# Patient Record
Sex: Male | Born: 1975 | Race: White | Hispanic: No | Smoking: Current every day smoker
Health system: Southern US, Community
[De-identification: ages and names within clinical notes are randomized; demographics above are authoritative.]

## PROBLEM LIST (undated history)

## (undated) DIAGNOSIS — F419 Anxiety disorder, unspecified: Secondary | ICD-10-CM

## (undated) DIAGNOSIS — S82899A Other fracture of unspecified lower leg, initial encounter for closed fracture: Secondary | ICD-10-CM

---

## 2003-05-13 ENCOUNTER — Ambulatory Visit (HOSPITAL_COMMUNITY): Admission: RE | Admit: 2003-05-13 | Discharge: 2003-05-13 | Payer: Self-pay | Admitting: General Surgery

## 2004-02-07 ENCOUNTER — Emergency Department (HOSPITAL_COMMUNITY): Admission: EM | Admit: 2004-02-07 | Discharge: 2004-02-07 | Payer: Self-pay | Admitting: Emergency Medicine

## 2004-02-08 ENCOUNTER — Inpatient Hospital Stay (HOSPITAL_COMMUNITY): Admission: EM | Admit: 2004-02-08 | Discharge: 2004-02-13 | Payer: Self-pay | Admitting: Emergency Medicine

## 2004-07-30 ENCOUNTER — Emergency Department (HOSPITAL_COMMUNITY): Admission: EM | Admit: 2004-07-30 | Discharge: 2004-07-30 | Payer: Self-pay | Admitting: *Deleted

## 2004-08-02 ENCOUNTER — Emergency Department (HOSPITAL_COMMUNITY): Admission: EM | Admit: 2004-08-02 | Discharge: 2004-08-02 | Payer: Self-pay | Admitting: Emergency Medicine

## 2004-10-18 ENCOUNTER — Emergency Department (HOSPITAL_COMMUNITY): Admission: EM | Admit: 2004-10-18 | Discharge: 2004-10-18 | Payer: Self-pay | Admitting: Emergency Medicine

## 2009-12-30 ENCOUNTER — Emergency Department (HOSPITAL_COMMUNITY): Admission: EM | Admit: 2009-12-30 | Discharge: 2009-12-31 | Payer: Self-pay | Admitting: Emergency Medicine

## 2010-03-11 ENCOUNTER — Emergency Department (HOSPITAL_COMMUNITY)
Admission: EM | Admit: 2010-03-11 | Discharge: 2010-03-11 | Payer: Self-pay | Source: Home / Self Care | Admitting: Emergency Medicine

## 2010-04-18 ENCOUNTER — Emergency Department (HOSPITAL_COMMUNITY)
Admission: EM | Admit: 2010-04-18 | Discharge: 2010-04-18 | Disposition: A | Discharge status: Home / Self Care | Payer: Self-pay | Attending: Emergency Medicine | Admitting: Emergency Medicine

## 2010-04-18 ENCOUNTER — Emergency Department (HOSPITAL_COMMUNITY): Payer: Self-pay

## 2010-04-18 DIAGNOSIS — F341 Dysthymic disorder: Secondary | ICD-10-CM | POA: Insufficient documentation

## 2010-04-18 DIAGNOSIS — R000 Tachycardia, unspecified: Secondary | ICD-10-CM | POA: Insufficient documentation

## 2010-04-18 DIAGNOSIS — Z79899 Other long term (current) drug therapy: Secondary | ICD-10-CM | POA: Insufficient documentation

## 2010-04-18 DIAGNOSIS — R071 Chest pain on breathing: Secondary | ICD-10-CM | POA: Insufficient documentation

## 2010-04-18 LAB — POCT I-STAT, CHEM 8
BUN: 15 mg/dL (ref 6–23)
Creatinine, Ser: 1 mg/dL (ref 0.4–1.5)
Glucose, Bld: 100 mg/dL — ABNORMAL HIGH (ref 70–99)
Sodium: 141 mEq/L (ref 135–145)
TCO2: 29 mmol/L (ref 0–100)

## 2010-07-02 ENCOUNTER — Emergency Department (HOSPITAL_COMMUNITY)
Admission: EM | Admit: 2010-07-02 | Discharge: 2010-07-03 | Disposition: A | Discharge status: Home / Self Care | Payer: Self-pay | Attending: Emergency Medicine | Admitting: Emergency Medicine

## 2010-07-02 ENCOUNTER — Emergency Department (HOSPITAL_COMMUNITY): Payer: Self-pay

## 2010-07-02 DIAGNOSIS — F3289 Other specified depressive episodes: Secondary | ICD-10-CM | POA: Insufficient documentation

## 2010-07-02 DIAGNOSIS — F411 Generalized anxiety disorder: Secondary | ICD-10-CM | POA: Insufficient documentation

## 2010-07-02 DIAGNOSIS — Y998 Other external cause status: Secondary | ICD-10-CM | POA: Insufficient documentation

## 2010-07-02 DIAGNOSIS — F329 Major depressive disorder, single episode, unspecified: Secondary | ICD-10-CM | POA: Insufficient documentation

## 2010-07-02 DIAGNOSIS — Y92009 Unspecified place in unspecified non-institutional (private) residence as the place of occurrence of the external cause: Secondary | ICD-10-CM | POA: Insufficient documentation

## 2010-07-02 DIAGNOSIS — W08XXXA Fall from other furniture, initial encounter: Secondary | ICD-10-CM | POA: Insufficient documentation

## 2010-07-02 DIAGNOSIS — Y93E9 Activity, other interior property and clothing maintenance: Secondary | ICD-10-CM | POA: Insufficient documentation

## 2010-07-02 DIAGNOSIS — M79609 Pain in unspecified limb: Secondary | ICD-10-CM | POA: Insufficient documentation

## 2010-07-02 DIAGNOSIS — Z79899 Other long term (current) drug therapy: Secondary | ICD-10-CM | POA: Insufficient documentation

## 2010-07-02 DIAGNOSIS — S20219A Contusion of unspecified front wall of thorax, initial encounter: Secondary | ICD-10-CM | POA: Insufficient documentation

## 2010-07-02 DIAGNOSIS — R079 Chest pain, unspecified: Secondary | ICD-10-CM | POA: Insufficient documentation

## 2010-07-02 DIAGNOSIS — M25559 Pain in unspecified hip: Secondary | ICD-10-CM | POA: Insufficient documentation

## 2010-07-27 NOTE — Group Therapy Note (Signed)
[name], Gray NO.:  [id number]   MEDICAL RECORD NO.:  [id number]         PATIENT TYPE:  INP   LOCATION:  A332                          FACILITY:  [facility]   PHYSICIAN:  Vania Rea, M.D. DATE OF BIRTH:  05-16-1975   DATE OF PROCEDURE:  02/09/2004  DATE OF DISCHARGE:                                   PROGRESS NOTE   This is a progress note on Pedro Gray FA#[id number]   Hospital Day #2   SUBJECTIVE:  Patient feels much better, but he is still having significant  pain.  He has been seen by the surgeons who are planning incision and  drainage and debridement tomorrow.   OBJECTIVE:  VITAL SIGNS:  Temperature 97.5, pulse 88, respirations 20, blood  pressure 106/77.  CHEST:  His chest is clear to auscultation bilaterally.  CARDIOVASCULAR SYSTEM:  Regular rhythm.  EXTREMITIES:  His extremities are  without edema.  He has no calf tenderness. His right arm swelling is  significantly reduced.  The arm is bandaged. Removal of the bandage is  draining yellow pus.  It is able to be expressed fairly easily.  There is  still an erythematous area around the ulcer.  Chances are, with another day  of antibiotics he may not need surgical intervention.   Labs:  His white count and hematocrit remain stable. His coags are normal  and his serum chemistry and liver functions are also normal.  His glucose is  slightly elevated at 119.   ASSESSMENT:  1.  Cellulitis of the left forearm.  2.  Abscess of the left forearm, now draining and much improved.  3.  Tobacco abuse.  4.  Chronic pain syndrome.   PLAN:  We will continue his pain medications.  We will reduce his Dilaudid  since he seems to be somewhat drowsy.  Continue his intravenous antibiotics  today and we will see if he is well enough to go home tomorrow.     Leop   LC/MEDQ  D:  02/09/2004  T:  02/09/2004  Job:  [id number]
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
## 2010-07-27 NOTE — Group Therapy Note (Signed)
NAMEDAHLTON, Pedro Gray             ACCOUNT NO.:  [id number]   MEDICAL RECORD NO.:  [id number]          PATIENT TYPE:  INP   LOCATION:  A332                          FACILITY:  [facility]   PHYSICIAN:  Margaretmary Dys, M.D.DATE OF BIRTH:  13-Oct-1975   DATE OF PROCEDURE:  02/11/2004  DATE OF DISCHARGE:                                   PROGRESS NOTE   Hospital Day #4   SUBJECTIVE:  The patient states he is feeling a little bit better this  morning, had a little bit of pain overnight.  He requested for pain medicine  and he slept quite well.  He has no fever, chills, or rigors.  Denies any  nausea, vomiting, diarrhea, abdominal pain.  He denies any redness in his  arms.   OBJECTIVE:  Claims he is a lot comfortable, pleasant, not in no acute  distress.  Temperature 98 degrees Fahrenheit, pulse 65, respiratory rate of  16, blood pressure 108/65.  HEENT:  Normocephalic, atraumatic.  Oral mucosa  was moist with no exudates.  Neck was supple.  No JVD, no lymphadenopathy.  Lungs are clear clinically with good air entry bilaterally.  Heart:  S1 & S2  regular.  No S3, S4 gallops or rubs.  Abdomen was soft, nontender.  Bowel  sounds were positive. No masses palpable.  Extremities:  Dressing over right  forearm noted.  The dressing was clean and dry.  There was no surrounding  cellulitis.   LABORATORY DATA/DIAGNOSTIC STUDIES:  White blood cell count is 4.9,  hemoglobin 12.6, hematocrit 35.7, platelet count 281 with no significant  left shift.  Sodium 134, potassium 4.3, chloride 100, CO2 30, glucose 107,  BUN 8, creatinine 0.9, calcium 8.6.  Cultures from blood and abscess are  pending.   ASSESSMENT AND PLAN:  1.  Pedro Gray has cellulitis of his right upper extremity status post      debridement of a necrotic ulcer.  The patient is currently on IV      antibiotics with Unasyn and clindamycin.  Will recommend to discontinue      clindamycin as the Unasyn should provide about the same coverage.   We      discussed this with surgery.  2.  Tobacco use.  The patient currently stable, has no cravings.  3.  Chronic pain syndrome with muscular spasm.  The patient is currently on      Valium and p.r.n. Dilaudid.   DISPOSITION:  The patient likely to be home in the next 1-2 days.     Ayor   AM/MEDQ  D:  02/11/2004  T:  02/11/2004  Job:  [id number]

## 2010-07-27 NOTE — Discharge Summary (Signed)
NAMEJACHAI, Pedro Gray NO.:  [id number]   MEDICAL RECORD NO.:  [id number]          PATIENT TYPE:  INP   LOCATION:  A332                          FACILITY:  [facility]   PHYSICIAN:  Vania Rea, M.D. DATE OF BIRTH:  02/27/1976   DATE OF ADMISSION:  02/08/2004  DATE OF DISCHARGE:  LH                                 DISCHARGE SUMMARY   PRIMARY CARE PHYSICIAN:  Dr. Elpidio Anis.   CONSULTS THIS ADMISSION:  Dr. Elpidio Anis for surgery.   DISCHARGE DIAGNOSIS:  1.  MRSA cellulitis of the right forearm.  2.  Abscess of the right forearm due to MRSA.  3.  Chronic pain syndrome.  4.  Chronic lung disease associated with childhood kerosine ingestion  5.  Tobacco abuse.   DISPOSITION:  Discharge to home.   CONDITION ON DISCHARGE:  Stable.   DISCHARGE MEDICATIONS:  1.  Clindamycin 300 mg four times daily for five days.  2.  Lortab one to two tablets every four hours as necessary.  3.  Valium 10 mg three times daily.  4.  Combivent inhaler p.r.n.   HOSPITAL COURSE:  Please refer to admission history and physical.   This is a 35 year old Caucasian man who was admitted with an unresolving  abscess of the right forearm status post aspiration in the emergency room  prior to admission.  When patient was admitted, patient's right arm was  extensively swollen, red and angry looking with redeveloping abscess.  The  patient was admitted, started on amoxicillin and clindamycin, and a surgical  consult obtained.  The patient subsequently had debridement of the necrotic  abscess and had dramatic improvement on his medications.  Blood cultures  were negative, however, wound cultures came back positive today for MRSA.  We have a choice between sending the patient home on clindamycin, vancomycin  or Zyvox.  We have decided to send the patient home on clindamycin since he  has had remarkable improvement with this drug intravenously.   Throughout his hospital course, the patient has  remained afebrile and  without leukocytosis.   This morning the patient has no complaints.  Vitals:  Temperature 97.3,  pulse 77, respirations 20, blood pressure 115/74.  Is saturating on 97% on  room air.  His chest is clear to auscultation bilaterally.  Cardiovascular  system:  Regular rhythm.  His abdomen is soft, nontender.  His extremities  are without edema.   LABS:  White count is 6.3, hemoglobin 14.4, hematocrit 40.6, MCV 92.9, RDW  12.1, platelets 337.  He has a normal differential on his white count.  His  sodium is 136, potassium 4.2, chloride 103, CO2 28, glucose 97, BUN 9,  creatinine 0.8, calcium 9.6.  His liver function tests done earlier in the  hospital course were complete and normal.   FOLLOW UP:  The patient is to follow up with primary care physician, Dr.  Elpidio Anis, this week.  He is to have home health to assist with his  dressings.  He is to return to the emergency room for onset of severe  diarrhea.  Leop   LC/MEDQ  D:  02/13/2004  T:  02/13/2004  Job:  [id number]

## 2010-07-27 NOTE — Op Note (Signed)
NAMEDUELL, HOLDREN             ACCOUNT NO.:  [id number]   MEDICAL RECORD NO.:  [id number]          PATIENT TYPE:  INP   LOCATION:  A332                          FACILITY:  [facility]   PHYSICIAN:  Dirk Dress. Katrinka Blazing, M.D.   DATE OF BIRTH:  23-Aug-1975   DATE OF PROCEDURE:  02/10/2004  DATE OF DISCHARGE:                                 OPERATIVE REPORT   PREOPERATIVE DIAGNOSIS:  Necrotic abscess, right forearm.   POSTOPERATIVE DIAGNOSIS:  Necrotic abscess, right forearm.   PROCEDURE:  Wide excision of a major abscess, right forearm.   SURGEON:  Dirk Dress. Katrinka Blazing, M.D.   PROCEDURE:  Under MAC anesthesia with 1% Xylocaine with epinephrine, the  right forearm was prepped and draped in a sterile field.  A wide elliptical  incision was made along the dorsal aspect of the abscess cavity, which was  along the ulnar aspect of the forearm.  The incision extended down to the  muscle and tendons and vessels.  All necrotic debris was removed.  A curette  was used to remove some of the necrotic-appearing granulations.  All  infectious debris that could be seen was removed.  The wound was packed with  Betadine-soaked 2 x 2 gauzes.  It was then covered with 4 x 4's and Kerlix.  The patient tolerated the procedure well.  He was transferred to a bed and  taken to the postanesthesia care unit for further monitoring.     Lero   LCS/MEDQ  D:  02/10/2004  T:  02/10/2004  Job:  [id number]

## 2010-07-27 NOTE — Group Therapy Note (Signed)
Pedro Gray, Pedro Gray             ACCOUNT NO.:  [id number]   MEDICAL RECORD NO.:  [id number]          PATIENT TYPE:  INP   LOCATION:  A332                          FACILITY:  [facility]   PHYSICIAN:  Margaretmary Dys, M.D.DATE OF BIRTH:  1975/10/05   DATE OF PROCEDURE:  02/12/2004  DATE OF DISCHARGE:                                   PROGRESS NOTE   Hospital day #5.   SUBJECTIVE:  The patient states that he has a little bit of pain in his  right forearm.  The patient has chronic pain issues due to his chronic low  back pain.  He, however, has no fever, chills or rigors.  No nausea,  vomiting, diarrhea or abdominal pain.  A wound inspection was done.   OBJECTIVE:  GENERAL APPEARANCE:  Conscious, alert and comfortable.  Not in  acute distress.  VITAL SIGNS:  The blood pressure is 115/65 with a pulse of 92, a respiratory  rate of 16, a temperature of 97.8 degrees Fahrenheit and oxygen saturation  98% on room air.  HEENT:  Normocephalic and atraumatic.  The oral mucosa was moist with no  exudates.  NECK:  Supple.  No JVD.  No lymphadenopathy.  LUNGS:  Clear with good air entry bilaterally.  HEART:  S1 and S2 regular.  No S3, gallops or rubs.  ABDOMEN:  Soft and nontender.  Bowel sounds positive.  EXTREMITIES:  Inspection of the wound was done.  It still shows some  erythema with gauze plug in the wound.  It is not actively draining.   LABORATORY STUDIES:  White blood cell count 5.6, hemoglobin 13.2, hematocrit  37.8, platelet count 317.  The sodium is 134, potassium 4.0, chloride 99,  CO2 28, glucose 86, BUN 12, creatinine 0.8 and calcium 9.0.  Cultures from  the wound have remained negative.   ASSESSMENT AND PLAN:  1.  Cellulitis of the right forearm, exact etiology unknown, status post      debridement of necrotic ulcer.  The patient is currently on IV      antibiotics with Unasyn and clindamycin.  Dr. Katrinka Blazing is here to see the      patient.  He will review him again in the morning  and discuss probably      putting him on a single-therapy agent.  2.  Tobacco use.  The patient was counseled on smoking cessation programs.      He declined any intervention at this time.  3.  Chronic pain syndrome with muscular pain.  He is currently on Valium and      p.r.n. Dilaudid.  The patient wants the dose of his Dilaudid increased.      He is also on Lortab.  Will continue at current doses as I do not see      any significant reason to increase the doses.   DISPOSITION:  The patient will likely go home in the next one to two days on  oral antibiotics.  Wound review by surgery tomorrow.     Ayor   AM/MEDQ  D:  02/12/2004  T:  02/12/2004  Job:  [id number] 

## 2010-07-27 NOTE — Group Therapy Note (Signed)
Pedro Gray, CUNLIFFE NO.:  [id number]   MEDICAL RECORD NO.:  [id number]          PATIENT TYPE:  INP   LOCATION:  A332                          FACILITY:  [facility]   PHYSICIAN:  Vania Rea, M.D. DATE OF BIRTH:  [birth year]   DATE OF PROCEDURE:  02/10/2004  DATE OF DISCHARGE:                                   PROGRESS NOTE   HOSPITAL DAY #3:   SUBJECTIVE:  The patient says he feels much better.  He has just returned  from surgery.   OBJECTIVE:  VITALS:  Temperature 97.7, pulse 69, respirations 18, blood  pressure 120/68.  He is saturating at 98% on room air.  CHEST:  His chest is  clear to auscultation bilaterally.  CARDIOVASCULAR SYSTEM:  Regular rhythm.  ABDOMEN:  His abdomen is soft and nontender.  EXTREMITIES:  His right upper  extremity is freshly bandaged, evidence of cellulitis has disappeared from  his right arm.   Labs this morning revealed white count 5.5, hematocrit stable at 37.6,  platelets stable at 304,000.  His serum chemistries are completely normal.   The operative report indicates that he had extensive necrosis around abscess  cavity, which needed wide debridement.   ASSESSMENT:  1.  Cellulitis of the right upper extremity, status post debridement of      necrotic ulcer.  Plan:  This gentleman will need a further 2 days of      intravenous antibiotics to stabilize him and then can be discharged to      home on oral antibiotics.  2.  Tobacco abuse:  The patient has opted to go out and smoke rather than      use a nicotine patch.  3.  Chronic pain syndrome with muscular spasm.  We will continue Valium and      p.r.n. Dilaudid.     Leop   LC/MEDQ  D:  02/10/2004  T:  02/10/2004  Job:  [id number]

## 2010-11-03 ENCOUNTER — Encounter: Payer: Self-pay | Admitting: Emergency Medicine

## 2010-11-03 ENCOUNTER — Emergency Department (HOSPITAL_COMMUNITY): Payer: Self-pay

## 2010-11-03 ENCOUNTER — Encounter (HOSPITAL_COMMUNITY): Payer: Self-pay | Admitting: Anesthesiology

## 2010-11-03 ENCOUNTER — Inpatient Hospital Stay (HOSPITAL_COMMUNITY): Payer: Self-pay

## 2010-11-03 ENCOUNTER — Inpatient Hospital Stay (HOSPITAL_COMMUNITY)
Admission: EM | Admit: 2010-11-03 | Discharge: 2010-11-06 | DRG: 493 | Disposition: A | Discharge status: Home / Self Care | Payer: Self-pay | Attending: Orthopedic Surgery | Admitting: Orthopedic Surgery

## 2010-11-03 ENCOUNTER — Inpatient Hospital Stay (HOSPITAL_COMMUNITY): Payer: Self-pay | Admitting: Anesthesiology

## 2010-11-03 ENCOUNTER — Encounter (HOSPITAL_COMMUNITY)
Admission: EM | Disposition: A | Discharge status: Home / Self Care | Payer: Self-pay | Source: Home / Self Care | Attending: Orthopedic Surgery

## 2010-11-03 DIAGNOSIS — S82899A Other fracture of unspecified lower leg, initial encounter for closed fracture: Secondary | ICD-10-CM | POA: Diagnosis present

## 2010-11-03 DIAGNOSIS — S02400A Malar fracture unspecified, initial encounter for closed fracture: Secondary | ICD-10-CM | POA: Diagnosis present

## 2010-11-03 DIAGNOSIS — S02401A Maxillary fracture, unspecified, initial encounter for closed fracture: Secondary | ICD-10-CM | POA: Diagnosis present

## 2010-11-03 DIAGNOSIS — S82853A Displaced trimalleolar fracture of unspecified lower leg, initial encounter for closed fracture: Principal | ICD-10-CM | POA: Diagnosis present

## 2010-11-03 DIAGNOSIS — W172XXA Fall into hole, initial encounter: Secondary | ICD-10-CM | POA: Diagnosis present

## 2010-11-03 HISTORY — DX: Anxiety disorder, unspecified: F41.9

## 2010-11-03 HISTORY — PX: ORIF ANKLE FRACTURE: SHX5408

## 2010-11-03 LAB — BASIC METABOLIC PANEL
BUN: 14 mg/dL (ref 6–23)
Calcium: 8.5 mg/dL (ref 8.4–10.5)
Creatinine, Ser: 0.89 mg/dL (ref 0.50–1.35)
GFR calc Af Amer: >60 mL/min (ref >60)
GFR calc non Af Amer: >60 mL/min (ref >60)

## 2010-11-03 LAB — SURGICAL PCR SCREEN
MRSA, PCR: NEGATIVE
Staphylococcus aureus: NEGATIVE

## 2010-11-03 LAB — CBC
MCH: 32.6 pg (ref 26.0–34.0)
MCHC: 34.4 g/dL (ref 30.0–36.0)
RDW: 12.4 % (ref 11.5–15.5)

## 2010-11-03 LAB — PROTIME-INR: Prothrombin Time: 13.8 seconds (ref 11.6–15.2)

## 2010-11-03 LAB — APTT: aPTT: 30 seconds (ref 24–37)

## 2010-11-03 SURGERY — OPEN REDUCTION INTERNAL FIXATION (ORIF) ANKLE FRACTURE
Anesthesia: Spinal | Laterality: Right | Wound class: Clean

## 2010-11-03 MED ORDER — ALPRAZOLAM 1 MG PO TABS: 2.0000 mg | ORAL_TABLET | Freq: Two times a day (BID) | ORAL | Status: DC

## 2010-11-03 MED ORDER — HYDROMORPHONE HCL 1 MG/ML IJ SOLN
INTRAMUSCULAR | Status: AC
  Administered 2010-11-03: 01:00:00
  Filled 2010-11-03: qty 1

## 2010-11-03 MED ORDER — KCL IN DEXTROSE-NACL 20-5-0.45 MEQ/L-%-% IV SOLN
INTRAVENOUS | Status: DC
  Administered 2010-11-03: 07:00:00 via INTRAVENOUS

## 2010-11-03 MED ORDER — ACETAMINOPHEN 650 MG RE SUPP: 650.0000 mg | Freq: Four times a day (QID) | RECTAL | Status: AC | PRN

## 2010-11-03 MED ORDER — HYDROMORPHONE HCL 1 MG/ML IJ SOLN
1.0000 mg | Freq: Once | INTRAMUSCULAR | Status: AC
  Administered 2010-11-03: 1 mg via INTRAVENOUS

## 2010-11-03 MED ORDER — ONDANSETRON HCL 4 MG/2ML IJ SOLN: 4.0000 mg | Freq: Four times a day (QID) | INTRAMUSCULAR | Status: DC | PRN

## 2010-11-03 MED ORDER — METHYLPHENIDATE HCL 5 MG PO TABS
10.0000 mg | ORAL_TABLET | Freq: Two times a day (BID) | ORAL | Status: DC
  Administered 2010-11-03 – 2010-11-06 (×7): 10 mg via ORAL
  Filled 2010-11-03 (×3): qty 2
  Filled 2010-11-03 (×2): qty 1
  Filled 2010-11-03 (×4): qty 2

## 2010-11-03 MED ORDER — SODIUM CHLORIDE 0.9 % IJ SOLN
INTRAMUSCULAR | Status: AC
  Administered 2010-11-03: 10 mL
  Filled 2010-11-03: qty 10

## 2010-11-03 MED ORDER — HYDROMORPHONE HCL 1 MG/ML IJ SOLN
1.0000 mg | INTRAMUSCULAR | Status: DC | PRN
  Administered 2010-11-03 – 2010-11-05 (×15): 1 mg via INTRAVENOUS
  Filled 2010-11-03 (×16): qty 1

## 2010-11-03 MED ORDER — FOLIC ACID 1 MG PO TABS: 1.0000 mg | ORAL_TABLET | Freq: Every day | ORAL | Status: AC

## 2010-11-03 MED ORDER — HYDROMORPHONE HCL 1 MG/ML IJ SOLN
1.0000 mg | Freq: Once | INTRAMUSCULAR | Status: AC
  Administered 2010-11-03: 1 mg via INTRAVENOUS
  Filled 2010-11-03: qty 1

## 2010-11-03 MED ORDER — CEFAZOLIN SODIUM 1-5 GM-% IV SOLN
INTRAVENOUS | Status: DC | PRN
  Administered 2010-11-03: 2 g via INTRAVENOUS

## 2010-11-03 MED ORDER — FOLIC ACID 5 MG/ML IJ SOLN
INTRAMUSCULAR | Status: AC
  Filled 2010-11-03: qty 0.2

## 2010-11-03 MED ORDER — ONDANSETRON HCL 4 MG PO TABS: 4.0000 mg | ORAL_TABLET | Freq: Four times a day (QID) | ORAL | Status: DC | PRN

## 2010-11-03 MED ORDER — THIAMINE HCL 100 MG/ML IJ SOLN
Freq: Once | INTRAVENOUS | Status: AC
  Administered 2010-11-03: 08:00:00 via INTRAVENOUS
  Filled 2010-11-03: qty 1000

## 2010-11-03 MED ORDER — METHYLPHENIDATE HCL ER (LA) 10 MG PO CP24: 10.0000 mg | ORAL_CAPSULE | Freq: Two times a day (BID) | ORAL | Status: DC

## 2010-11-03 MED ORDER — MIDAZOLAM HCL 2 MG/2ML IJ SOLN
INTRAMUSCULAR | Status: AC
  Filled 2010-11-03: qty 2

## 2010-11-03 MED ORDER — KCL IN DEXTROSE-NACL 20-5-0.45 MEQ/L-%-% IV SOLN
INTRAVENOUS | Status: DC
  Administered 2010-11-03 – 2010-11-04 (×2): via INTRAVENOUS

## 2010-11-03 MED ORDER — SODIUM CHLORIDE 0.9 % IR SOLN
Status: DC | PRN
  Administered 2010-11-03: 1000 mL

## 2010-11-03 MED ORDER — PROSIGHT PO TABS
1.0000 | ORAL_TABLET | Freq: Every day | ORAL | Status: DC
  Administered 2010-11-04: 1 via ORAL
  Filled 2010-11-03 (×2): qty 1

## 2010-11-03 MED ORDER — PROPOFOL 10 MG/ML IV EMUL
INTRAVENOUS | Status: AC
  Filled 2010-11-03: qty 20

## 2010-11-03 MED ORDER — M.V.I. ADULT IV INJ
INJECTION | INTRAVENOUS | Status: AC
  Filled 2010-11-03: qty 10

## 2010-11-03 MED ORDER — ALPRAZOLAM 1 MG PO TABS
2.0000 mg | ORAL_TABLET | Freq: Three times a day (TID) | ORAL | Status: DC
  Administered 2010-11-03 – 2010-11-06 (×10): 2 mg via ORAL
  Filled 2010-11-03 (×10): qty 2

## 2010-11-03 MED ORDER — MIDAZOLAM HCL 5 MG/ML IJ SOLN
INTRAMUSCULAR | Status: DC | PRN
  Administered 2010-11-03 (×3): 2 mg via INTRAVENOUS

## 2010-11-03 MED ORDER — LORAZEPAM 1 MG PO TABS: 1.0000 mg | ORAL_TABLET | Freq: Four times a day (QID) | ORAL | Status: AC | PRN

## 2010-11-03 MED ORDER — BUPIVACAINE-EPINEPHRINE PF 0.5-1:200000 % IJ SOLN
INTRAMUSCULAR | Status: AC
  Filled 2010-11-03: qty 20

## 2010-11-03 MED ORDER — SENNOSIDES-DOCUSATE SODIUM 8.6-50 MG PO TABS: 1.0000 | ORAL_TABLET | Freq: Every evening | ORAL | Status: DC | PRN

## 2010-11-03 MED ORDER — THERA M PLUS PO TABS: 1.0000 | ORAL_TABLET | Freq: Every day | ORAL | Status: AC

## 2010-11-03 MED ORDER — THIAMINE HCL 100 MG/ML IJ SOLN
INTRAMUSCULAR | Status: AC
  Filled 2010-11-03: qty 2

## 2010-11-03 MED ORDER — DIPHENHYDRAMINE HCL 12.5 MG/5ML PO ELIX: 12.5000 mg | ORAL_SOLUTION | ORAL | Status: DC | PRN

## 2010-11-03 MED ORDER — CEFAZOLIN SODIUM-DEXTROSE 2-3 GM-% IV SOLR
2.0000 g | INTRAVENOUS | Status: DC
  Filled 2010-11-03: qty 50

## 2010-11-03 MED ORDER — FENTANYL CITRATE 0.05 MG/ML IJ SOLN
INTRAMUSCULAR | Status: AC
  Filled 2010-11-03: qty 2

## 2010-11-03 MED ORDER — ACETAMINOPHEN 325 MG PO TABS: 650.0000 mg | ORAL_TABLET | Freq: Four times a day (QID) | ORAL | Status: AC | PRN

## 2010-11-03 MED ORDER — ALUM & MAG HYDROXIDE-SIMETH 200-200-20 MG/5ML PO SUSP: 30.0000 mL | Freq: Four times a day (QID) | ORAL | Status: DC | PRN

## 2010-11-03 MED ORDER — BUPIVACAINE HCL 0.75 % IJ SOLN
INTRAMUSCULAR | Status: DC | PRN
  Administered 2010-11-03: 12 mg via INTRATHECAL

## 2010-11-03 MED ORDER — THIAMINE HCL 100 MG/ML IJ SOLN: Freq: Once | INTRAVENOUS | Status: DC

## 2010-11-03 MED ORDER — TRAZODONE HCL 50 MG PO TABS
150.0000 mg | ORAL_TABLET | Freq: Every evening | ORAL | Status: DC | PRN
  Administered 2010-11-04: 150 mg via ORAL
  Filled 2010-11-03: qty 3

## 2010-11-03 MED ORDER — LORAZEPAM 2 MG/ML IJ SOLN: 1.0000 mg | Freq: Four times a day (QID) | INTRAMUSCULAR | Status: DC | PRN

## 2010-11-03 MED ORDER — ONDANSETRON HCL 4 MG/2ML IJ SOLN
4.0000 mg | Freq: Once | INTRAMUSCULAR | Status: AC
  Administered 2010-11-03: 4 mg via INTRAVENOUS
  Filled 2010-11-03: qty 2

## 2010-11-03 MED ORDER — HYDROCODONE-ACETAMINOPHEN 5-325 MG PO TABS: 1.0000 | ORAL_TABLET | ORAL | Status: AC | PRN

## 2010-11-03 MED ORDER — HYDROMORPHONE HCL 1 MG/ML IJ SOLN
INTRAMUSCULAR | Status: AC
  Administered 2010-11-03: 1 mg via INTRAVENOUS
  Filled 2010-11-03: qty 1

## 2010-11-03 MED ORDER — LIDOCAINE HCL (PF) 1 % IJ SOLN
INTRAMUSCULAR | Status: AC
  Filled 2010-11-03: qty 5

## 2010-11-03 MED ORDER — MIRTAZAPINE 30 MG PO TABS
45.0000 mg | ORAL_TABLET | Freq: Every day | ORAL | Status: DC
Start: 2010-11-03 — End: 2010-11-06
  Administered 2010-11-03 – 2010-11-05 (×3): 45 mg via ORAL
  Filled 2010-11-03 (×3): qty 2

## 2010-11-03 MED ORDER — FENTANYL CITRATE 0.05 MG/ML IJ SOLN
INTRAMUSCULAR | Status: DC | PRN
  Administered 2010-11-03: 25 ug via INTRAVENOUS
  Administered 2010-11-03: 50 ug via INTRAVENOUS

## 2010-11-03 MED ORDER — METOCLOPRAMIDE HCL 5 MG/ML IJ SOLN: 5.0000 mg | Freq: Three times a day (TID) | INTRAMUSCULAR | Status: DC | PRN

## 2010-11-03 MED ORDER — BUPIVACAINE-EPINEPHRINE PF 0.5-1:200000 % IJ SOLN
INTRAMUSCULAR | Status: DC | PRN
  Administered 2010-11-03: 60 mL

## 2010-11-03 MED ORDER — BUPIVACAINE IN DEXTROSE 0.75-8.25 % IT SOLN
INTRATHECAL | Status: AC
  Filled 2010-11-03: qty 2

## 2010-11-03 MED ORDER — ETOMIDATE 2 MG/ML IV SOLN
0.1000 mg/kg | Freq: Once | INTRAVENOUS | Status: AC
  Administered 2010-11-03: 7.72 mg via INTRAVENOUS
  Filled 2010-11-03: qty 10

## 2010-11-03 MED ORDER — BISACODYL 10 MG RE SUPP: 10.0000 mg | Freq: Every day | RECTAL | Status: DC | PRN

## 2010-11-03 MED ORDER — HYDROMORPHONE HCL 1 MG/ML IJ SOLN: 0.5000 mg | INTRAMUSCULAR | Status: DC | PRN

## 2010-11-03 MED ORDER — METOCLOPRAMIDE HCL 10 MG PO TABS: 5.0000 mg | ORAL_TABLET | Freq: Three times a day (TID) | ORAL | Status: DC | PRN

## 2010-11-03 MED ORDER — KCL IN DEXTROSE-NACL 20-5-0.45 MEQ/L-%-% IV SOLN: INTRAVENOUS | Status: DC

## 2010-11-03 MED ORDER — HYDROCODONE-ACETAMINOPHEN 5-325 MG PO TABS: 1.0000 | ORAL_TABLET | ORAL | Status: DC | PRN

## 2010-11-03 MED ORDER — IBUPROFEN 800 MG PO TABS
800.0000 mg | ORAL_TABLET | Freq: Once | ORAL | Status: AC
  Administered 2010-11-03: 800 mg via ORAL
  Filled 2010-11-03: qty 1

## 2010-11-03 MED ORDER — FENTANYL CITRATE 0.05 MG/ML IJ SOLN
INTRAMUSCULAR | Status: DC | PRN
  Administered 2010-11-03: 25 ug via INTRATHECAL

## 2010-11-03 MED ORDER — THIAMINE HCL 100 MG/ML IJ SOLN
100.0000 mg | Freq: Every day | INTRAMUSCULAR | Status: DC
  Administered 2010-11-03: 100 mg via INTRAVENOUS
  Filled 2010-11-03: qty 2

## 2010-11-03 MED ORDER — VITAMIN B-1 100 MG PO TABS
100.0000 mg | ORAL_TABLET | Freq: Every day | ORAL | Status: DC
  Administered 2010-11-04 – 2010-11-06 (×3): 100 mg via ORAL
  Filled 2010-11-03 (×3): qty 1

## 2010-11-03 MED ORDER — LACTATED RINGERS IV SOLN
INTRAVENOUS | Status: DC | PRN
  Administered 2010-11-03 (×3): via INTRAVENOUS

## 2010-11-03 MED ORDER — CEFAZOLIN SODIUM 1-5 GM-% IV SOLN
INTRAVENOUS | Status: AC
  Filled 2010-11-03: qty 50

## 2010-11-03 MED ORDER — HYDROMORPHONE HCL 1 MG/ML IJ SOLN: 1.0000 mg | INTRAMUSCULAR | Status: DC | PRN

## 2010-11-03 MED ORDER — CEFAZOLIN SODIUM 1-5 GM-% IV SOLN
1.0000 g | Freq: Four times a day (QID) | INTRAVENOUS | Status: AC
  Administered 2010-11-03 – 2010-11-04 (×3): 1 g via INTRAVENOUS
  Filled 2010-11-03 (×3): qty 50

## 2010-11-03 MED ORDER — VITAMIN B-1 100 MG PO TABS: 100.0000 mg | ORAL_TABLET | Freq: Every day | ORAL | Status: DC

## 2010-11-03 MED ORDER — OXYCODONE-ACETAMINOPHEN 5-325 MG PO TABS
1.0000 | ORAL_TABLET | ORAL | Status: DC | PRN
  Administered 2010-11-04: 1 via ORAL
  Administered 2010-11-04: 2 via ORAL
  Filled 2010-11-03 (×2): qty 2

## 2010-11-03 MED ORDER — OXYCODONE-ACETAMINOPHEN 5-325 MG PO TABS
1.0000 | ORAL_TABLET | Freq: Once | ORAL | Status: AC
  Administered 2010-11-03: 1 via ORAL
  Filled 2010-11-03: qty 1

## 2010-11-03 MED ORDER — DEXTROSE 5 % IV SOLN
1.0000 g | Freq: Once | INTRAVENOUS | Status: AC
  Administered 2010-11-03: 1 g via INTRAVENOUS
  Filled 2010-11-03: qty 1

## 2010-11-03 MED ORDER — PROPOFOL 10 MG/ML IV EMUL
INTRAVENOUS | Status: DC | PRN
  Administered 2010-11-03: 40 ug/kg/min via INTRAVENOUS

## 2010-11-03 SURGICAL SUPPLY — 76 items
BAG HAMPER (MISCELLANEOUS) ×2 IMPLANT
BANDAGE ELASTIC 4 VELCRO NS (GAUZE/BANDAGES/DRESSINGS) ×1 IMPLANT
BANDAGE ELASTIC 6 VELCRO NS (GAUZE/BANDAGES/DRESSINGS) ×3 IMPLANT
BANDAGE ESMARK 4X12 BL STRL LF (DISPOSABLE) ×1 IMPLANT
BIT DRILL 2.5X110 QC LCP DISP (BIT) ×2 IMPLANT
BIT DRILL 2.8 (BIT) ×1
BIT DRILL CANN QC 2.8X165 (BIT) ×1 IMPLANT
BIT DRILL QC 3.5X110 (BIT) ×1 IMPLANT
BLADE SURG 15 STRL LF DISP TIS (BLADE) IMPLANT
BLADE SURG 15 STRL SS (BLADE) ×2
BLADE SURG SZ10 CARB STEEL (BLADE) ×2 IMPLANT
BNDG CMPR 12X4 ELC STRL LF (DISPOSABLE) ×1
BNDG ESMARK 4X12 BLUE STRL LF (DISPOSABLE) ×2
CAP PIN PROTECTOR ORTHO WHT (CAP) IMPLANT
CHLORAPREP W/TINT 10.5 ML (MISCELLANEOUS) ×1 IMPLANT
CHLORAPREP W/TINT 26ML (MISCELLANEOUS) ×2 IMPLANT
CLOTH BEACON ORANGE TIMEOUT ST (SAFETY) ×2 IMPLANT
COVER LIGHT HANDLE STERIS (MISCELLANEOUS) ×4 IMPLANT
CUFF TOURNIQUET SINGLE 34IN LL (TOURNIQUET CUFF) ×2 IMPLANT
DECANTER SPIKE VIAL GLASS SM (MISCELLANEOUS) ×2 IMPLANT
DRAPE C-ARM FOLDED MOBILE STRL (DRAPES) ×2 IMPLANT
DRILL BIT 2.8MM (BIT) ×2
GAUZE XEROFORM 5X9 LF (GAUZE/BANDAGES/DRESSINGS) ×2 IMPLANT
GLOVE ECLIPSE 7.0 STRL STRAW (GLOVE) ×3 IMPLANT
GLOVE INDICATOR 7.5 STRL GRN (GLOVE) ×1 IMPLANT
GLOVE SKINSENSE NS SZ8.0 LF (GLOVE) ×1
GLOVE SKINSENSE STRL SZ8.0 LF (GLOVE) ×1 IMPLANT
GLOVE SS N UNI LF 8.5 STRL (GLOVE) ×2 IMPLANT
GOWN BRE IMP SLV AUR XL STRL (GOWN DISPOSABLE) ×3 IMPLANT
GOWN STRL REIN XL XLG (GOWN DISPOSABLE) ×2 IMPLANT
K-WIRE 1.25 TRCR POINT 150 (WIRE)
K-WIRE 1.6X150 (WIRE)
K-WIRE 2.0X150M (WIRE)
K-WIRE 5 THRD TROCAR 1.6X150 (WIRE) ×4
KIT ROOM TURNOVER APOR (KITS) ×2 IMPLANT
KWIRE 1.25 TRCR POINT 150 (WIRE) IMPLANT
KWIRE 1.6X150 (WIRE) IMPLANT
KWIRE 2.0X150M (WIRE) IMPLANT
KWIRE 5 THRD TROCAR 1.6X150 (WIRE) IMPLANT
MANIFOLD NEPTUNE II (INSTRUMENTS) ×2 IMPLANT
NDL HYPO 21X1.5 SAFETY (NEEDLE) ×1 IMPLANT
NEEDLE HYPO 21X1.5 SAFETY (NEEDLE) ×2 IMPLANT
NS IRRIG 1000ML POUR BTL (IV SOLUTION) ×2 IMPLANT
PACK BASIC LIMB (CUSTOM PROCEDURE TRAY) ×2 IMPLANT
PAD ABD 5X9 TENDERSORB (GAUZE/BANDAGES/DRESSINGS) ×5 IMPLANT
PAD ARMBOARD 7.5X6 YLW CONV (MISCELLANEOUS) ×2 IMPLANT
PAD CAST 4YDX4 CTTN HI CHSV (CAST SUPPLIES) ×1 IMPLANT
PADDING CAST COTTON 4X4 STRL (CAST SUPPLIES) ×2
PIN CAPS ORTHO GREEN .062 (PIN) IMPLANT
PLATE LCP 3.5 1/3 TUB 12HX141 (Plate) ×1 IMPLANT
SCREW CANC FT 4.0X20 (Screw) ×1 IMPLANT
SCREW CANC FT ST SFS 4X16 (Screw) ×1 IMPLANT
SCREW CANC FT/18 4.0 (Screw) ×2 IMPLANT
SCREW CORTEX 3.5 12MM (Screw) ×3 IMPLANT
SCREW CORTEX 3.5 14MM (Screw) ×2 IMPLANT
SCREW CORTEX 3.5 16MM (Screw) ×1 IMPLANT
SCREW CORTEX 3.5 22MM (Screw) ×1 IMPLANT
SCREW LOCK CORT ST 3.5X12 (Screw) IMPLANT
SCREW LOCK CORT ST 3.5X14 (Screw) IMPLANT
SCREW LOCK CORT ST 3.5X16 (Screw) IMPLANT
SCREW LOCK CORT ST 3.5X22 (Screw) IMPLANT
SET BASIN LINEN APH (SET/KITS/TRAYS/PACK) ×2 IMPLANT
SPLINT IMMOBILIZER J 3INX20FT (CAST SUPPLIES) ×1
SPLINT J IMMOBILIZER 3X20FT (CAST SUPPLIES) ×1 IMPLANT
SPONGE GAUZE 4X4 12PLY (GAUZE/BANDAGES/DRESSINGS) ×2 IMPLANT
SPONGE LAP 18X18 X RAY DECT (DISPOSABLE) ×3 IMPLANT
STAPLER VISISTAT 35W (STAPLE) ×2 IMPLANT
SUT ETHILON 3 0 FSL (SUTURE) ×1 IMPLANT
SUT MON AB 0 CT1 (SUTURE) ×2 IMPLANT
SUT MON AB 2-0 CT1 36 (SUTURE) ×1 IMPLANT
SUT MON AB 2-0 SH 27 (SUTURE)
SUT MON AB 2-0 SH27 (SUTURE) IMPLANT
SUT SILK 2 0 (SUTURE) ×2
SUT SILK 2-0 18XBRD TIE 12 (SUTURE) IMPLANT
SYR 30ML LL (SYRINGE) ×2 IMPLANT
SYR BULB IRRIGATION 50ML (SYRINGE) ×2 IMPLANT
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
## 2010-11-03 NOTE — Anesthesia Preprocedure Evaluation (Signed)
Anesthesia Evaluation  Name, MR# and DOB Patient awake  General Assessment Comment  Reviewed: Allergy & Precautions, H&P , NPO status , Patient's Chart, lab work & pertinent test results  Airway Mallampati: II TM Distance: >3 FB Neck ROM: Full  Mouth opening: Limited Mouth Opening  Dental  (+)    Pulmonary    pulmonary exam normalPulmonary Exam Normal     Cardiovascular Exercise Tolerance: Good Regular     Neuro/Psych    (+) Anxiety, Depression,  Negative Neurological ROS    GI/Hepatic/Renal   negative Liver ROS  negative Renal ROS   GERD Controlled     Endo/Other  Negative Endocrine ROS (+)      Abdominal Normal abdominal exam  (+)   Musculoskeletal negative musculoskeletal ROS (+)   Hematology negative hematology ROS (+)   Peds negative pediatric ROS (+)  Reproductive/Obstetrics negative OB ROS    Anesthesia Other Findings Anxiety/Depression GERD not controlled, can't afford medications. 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 THC USE admitted.  ETOH Abuse and use.  NPO for almost 48 hours.            Anesthesia Physical Anesthesia Plan  ASA: II  Anesthesia Plan:    Post-op Pain Management:    Induction:   Airway Management Planned: Simple Face Mask  Additional Equipment:   Intra-op Plan:   Post-operative Plan:   Informed Consent: I have reviewed the patients History and Physical, chart, labs and discussed the procedure including the risks, benefits and alternatives for the proposed anesthesia with the patient or authorized representative who has indicated his/her understanding and acceptance.   Dental advisory given  Plan Discussed with: CRNA, Anesthesiologist ([facility]) and Surgeon  Anesthesia Plan Comments: (Everyone aware of SAB/Prop drip plan, as per [facility] routine Saturday anesthetics. Patient comfortable and agreeable.)        Anesthesia Quick Evaluation

## 2010-11-03 NOTE — Transfer of Care (Signed)
  Anesthesia Post-op Note  Patient: Pedro Gray  Procedure(s) Performed:  OPEN REDUCTION INTERNAL FIXATION (ORIF) ANKLE FRACTURE - with Synthes Small Fragment Plates and Screws  Patient Location: PACU  Anesthesia Type: Spinal  Level of Consciousness: awake, alert  and oriented  Airway and Oxygen Therapy: Patient Spontanous Breathing and Patient connected to face mask oxygen  Post-op Pain: mild  Post-op Assessment: Post-op Vital signs reviewed and Patient's Cardiovascular Status Stable  Post-op Vital Signs: Reviewed and stable  Complications: No apparent anesthesia complications

## 2010-11-03 NOTE — ED Notes (Signed)
[place] patient with obvious deformity to right ankle.

## 2010-11-03 NOTE — Anesthesia Procedure Notes (Addendum)
Spinal Block  Patient location during procedure: OR Start time: 11/03/2010 2:10 PM Staffing Performed by: resident/CRNA  Preanesthetic Checklist Completed: patient identified, timeout performed and IV checked Spinal Block Patient position: right lateral decubitus Prep: Betadine Patient monitoring: heart rate, cardiac monitor, continuous pulse ox and blood pressure  Spinal Block  End time: 11/03/2010 2:20 PM Spinal Block Patient position: right lateral decubitus Prep: Betadine Approach: midline Location: L4-5 Injection technique: single-shot Needle Needle type: Sprotte  Needle gauge: 24 G Needle length: 9 cm Additional Notes [id number] 2012 - 10 Marcaine 12 Mg Fentanyl 25 MCG,

## 2010-11-03 NOTE — Preoperative (Signed)
No Bete Blocker Ancef Within one hour od incisuion

## 2010-11-03 NOTE — ED Notes (Signed)
See ED note  Joya Gaskins, MD 11/04/10 [phone]

## 2010-11-03 NOTE — Anesthesia Postprocedure Evaluation (Addendum)
Anesthesia Post Note  Patient: Pedro Gray  Procedure(s) Performed:  OPEN REDUCTION INTERNAL FIXATION (ORIF) ANKLE FRACTURE - with Synthes Small Fragment Plates and Screws  Anesthesia type: Spinal  Patient location: PACU  Post pain: Pain level controlled and Adequate analgesia  Post assessment: Post-op Vital signs reviewed, Patient's Cardiovascular Status Stable and Respiratory Function Stable  Last Vitals:  Filed Vitals:   11/03/10 1615  BP:   Pulse: 76  Temp: 98.1 F (36.7 C)  Resp: 12    Post vital signs: Reviewed and stable  Level of consciousness: awake, alert  and oriented  Complications: No apparent anesthesia complications   11-04-2010 @ 1300HRS.   Pt asleep at the time of visit. Anticipated issues with pain management. No anesthesia related problems.  Patient was satisified with decision to proceed with Spinal anesthetic yesterday.

## 2010-11-03 NOTE — Op Note (Signed)
Surgical report  Preop diagnosis closed fracture right ankle, trimalleolar fracture Postop diagnosis same Procedure open reduction internal fixation right ankle Implants Synthes lateral plate and screws with interfragmentary screw, medial side 2 K wires, 0.062.  Surgeon Romeo Apple Assistants none Anesthetic spinal Operative findings displaced Weber C. pronation external rotation trimalleolar ankle fracture  The patient was identified in the preop area and the site was marked. Surgical consent was signed. The patient was taken to surgery. A spinal anesthetic was placed. The patient was placed supine on the operating table. The right leg was prepped and draped a bump was placed in the right hip. Timeout procedure was executed.   The limb was exsanguinated with a 4 inch Esmarch and the tourniquet was elevated to 300 mmHg. Tourniquet time 91 minutes.  A lateral incision was made over the fibula and taken down to bone with a full-thickness skin flap. The fracture site was identified in with subperiosteal dissection. The fracture was cleaned irrigated and reduced with manipulation and held with multiple bone clamps. Rate grafts were taken to confirm fracture reduction. Following this an interfragmentary screw was placed using standard AO technique with a 3.5 drill bit, golf T., 2.5 drill bit, countersink, depth gauge. The interfragmentary screw was placed and the fracture was reduced. The bone clamps were removed.  A lateral plate was placed contoured to fit the fibula. Standard AO technique was applied. Multiple screws were placed. Radiographs confirm reduction of the fracture the position of the hardware and reduction of the mortise.  We next turned our attention to the medial side. The skin incision was made in a curvilinear fashion at the site of the fracture. Full-thickness skin flaps were created. Venous Tributaries were coagulated and tied with 2-0 silk. The fracture was reduced and held with a  pointed reduction clamp and an x-ray was obtained to confirm fracture reduction. 2 parallel 0.062 K wires were placed. Position was confirmed with x-ray. The fracture was reduced. The pins were backed out bent and then buried into the bone. Final x-rays including 3 views confirmed that the fracture had been reduced, the hardware was in good position.  We irrigated all the wound with thorough amounts of saline close the medial side with 3-0 nylon sutures in a standard fashion. We then closed the lateral side with 0 Monocryl and staples. We injected the wound with 60 cc of Marcaine.  Sterile dressings and a posterior splint with her applied to the ankle with the foot in neutral position  The patient was taken to the recovery room in stable condition  I expect the postoperative plan to include the following: Physical therapy in the hospital nonweightbearing. Nonweightbearing for a total of 6 weeks. At the six-week x-ray if her x-rays look good the patient can start weightbearing in a cast.  At 12 weeks the cast can be removed.

## 2010-11-03 NOTE — Brief Op Note (Addendum)
11/03/2010  4:24 PM  PATIENT:  Pedro Gray  35 y.o. male  PRE-OPERATIVE DIAGNOSIS:  trimalleolar fracture right ankle  POST-OPERATIVE DIAGNOSIS:  trimalleolar fracture right ankle  PROCEDURE:  Procedure(s): OPEN REDUCTION INTERNAL FIXATION (ORIF) RIGHT ANKLE FRACTURE  SYNTHES 1/3 TUBULAR PLATE WITH INTERFRAG SCREW + 2 - 0.62 k WIRES   SURGEON:  Surgeon(s): Fuller Canada, MD  PHYSICIAN ASSISTANT:   ASSISTANTS: none   ANESTHESIA:   none  ESTIMATED BLOOD LOSS: * NONE *   BLOOD ADMINISTERED:none  DRAINS: none   LOCAL MEDICATIONS USED:  MARCAINE 60CC  SPECIMEN:  No Specimen  DISPOSITION OF SPECIMEN:  N/A  COUNTS:  YES  TOURNIQUET:   Total Tourniquet Time Documented: Thigh (Right) - 91 minutes  DICTATION #:   PLAN OF CARE: PACU THEN FLOOR   PATIENT DISPOSITION:  PACU - hemodynamically stable.   Delay start of Pharmacological VTE agent (>24hrs) due to surgical blood loss or risk of bleeding:  yes

## 2010-11-03 NOTE — H&P (Signed)
Pedro Gray is an 35 y.o. male.   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 He could not walk.  In the ER the Doctor relocated the foot and placed him in a splint   Past Medical History  Diagnosis Date  . Anxiety     History reviewed. No pertinent past surgical history.  History reviewed. No pertinent family history. Social History:  reports that he has been smoking.  He does not have any smokeless tobacco history on file. He reports that he drinks about 1.8 ounces of alcohol per week. He reports that he uses illicit drugs (Marijuana).  Allergies: No Known Allergies  Medications Prior to Admission  Medication Dose Route Frequency Provider Last Rate Last Dose  . acetaminophen (TYLENOL) tablet 650 mg  650 mg Oral Q6H PRN Fuller Canada, MD       Or  . acetaminophen (TYLENOL) suppository 650 mg  650 mg Rectal Q6H PRN Fuller Canada, MD      . ALPRAZolam Prudy Feeler) tablet 2 mg  2 mg Oral TID Fuller Canada, MD   2 mg at 11/03/10 1001  . alum & mag hydroxide-simeth (MAALOX/MYLANTA) 200-200-20 MG/5ML suspension 30 mL  30 mL Oral Q6H PRN Fuller Canada, MD      . bisacodyl (DULCOLAX) suppository 10 mg  10 mg Rectal Daily PRN Fuller Canada, MD      . cefTRIAXone (ROCEPHIN) 1 g in dextrose 5 % 50 mL IVPB  1 g Intravenous Once Joya Gaskins, MD 100 mL/hr at 11/03/10 0235 1 g at 11/03/10 0235  . dextrose 5 % and 0.45 % NaCl with KCl 20 mEq/L infusion   Intravenous Continuous Fuller Canada, MD 75 mL/hr at 11/03/10 0706    . etomidate (AMIDATE) injection 7.72 mg  0.1 mg/kg Intravenous Once Joya Gaskins, MD   7.72 mg at 11/03/10 0146  . folic acid (FOLVITE) tablet 1 mg  1 mg Oral Daily Fuller Canada, MD      . HYDROcodone-acetaminophen [facility])  5-325 MG per tablet 1-2 tablet  1-2 tablet Oral Q4H PRN Fuller Canada, MD      . HYDROmorphone (DILAUDID) 1 MG/ML injection           . HYDROmorphone (DILAUDID) injection 0.5-1 mg  0.5-1 mg Intravenous Q2H PRN Fuller Canada, MD      . HYDROmorphone (DILAUDID) injection 1 mg  1 mg Intravenous Once Joya Gaskins, MD   1 mg at 11/03/10 0128  . HYDROmorphone (DILAUDID) injection 1 mg  1 mg Intravenous Once Fuller Canada, MD   1 mg at 11/03/10 0215  . HYDROmorphone (DILAUDID) injection 1 mg  1 mg Intravenous Q2H PRN Fuller Canada, MD   1 mg at 11/03/10 1149  . LORazepam (ATIVAN) tablet 1 mg  1 mg Oral Q6H PRN Fuller Canada, MD       Or  . LORazepam (ATIVAN) injection 1 mg  1 mg Intravenous Q6H PRN Fuller Canada, MD      . methylphenidate (RITALIN) tablet 10 mg  10 mg Oral BID Fuller Canada, MD   10 mg at 11/03/10 1034  . multivitamins ther. w/minerals tablet 1 tablet  1 tablet Oral Daily Fuller Canada, MD      . ondansetron [facility]) tablet 4 mg  4 mg Oral Q6H PRN Fuller Canada, MD       Or  . ondansetron [facility]) injection 4 mg  4 mg Intravenous Q6H PRN Fuller Canada, MD      . sodium chloride 0.9 % 1,000 mL with thiamine 100 mg, folic acid 1 mg, multivitamins adult 10 mL infusion   Intravenous Once Fuller Canada, MD 100 mL/hr at 11/03/10 0742    . thiamine (B-1) injection 100 mg  100 mg Intravenous Daily Fuller Canada, MD   100 mg at 11/03/10 1001  . vitamin B-1 tablet 100 mg  100 mg Oral Daily Fuller Canada, MD      . DISCONTD: ALPRAZolam Prudy Feeler) tablet 2 mg  2 mg Oral BID Fuller Canada, MD      . DISCONTD: alum & mag hydroxide-simeth (MAALOX/MYLANTA) 200-200-20 MG/5ML suspension 30 mL  30 mL Oral Q6H PRN Fuller Canada, MD      . DISCONTD: bisacodyl (DULCOLAX) suppository 10 mg  10 mg Rectal Daily PRN Fuller Canada, MD      . DISCONTD: dextrose 5 % and 0.45 % NaCl with KCl 20 mEq/L infusion   Intravenous Continuous Fuller Canada, MD      .  DISCONTD: HYDROmorphone (DILAUDID) injection 1 mg  1 mg Intravenous Q2H PRN Fuller Canada, MD      . DISCONTD: methylphenidate (RITALIN LA) 24 hr capsule 10 mg  10 mg Oral BID Fuller Canada, MD      . DISCONTD: ondansetron (ZOFRAN) injection 4 mg  4 mg Intravenous Q6H PRN Fuller Canada, MD      . DISCONTD: ondansetron (ZOFRAN) tablet 4 mg  4 mg Oral Q6H PRN Fuller Canada, MD      . DISCONTD: sodium chloride 0.9 % 1,000 mL with thiamine 100 mg, folic acid 1 mg, multivitamins adult 10 mL infusion   Intravenous Once Fuller Canada, MD       No current outpatient prescriptions on file as of 11/03/2010.    Results for orders placed during the hospital encounter of 11/03/10 (from the past 48 hour(s))  BASIC METABOLIC PANEL     Status: Normal   Collection Time   11/03/10  7:44 AM      Component Value Range Comment   Sodium 139  135 - 145 (mEq/L)    Potassium 4.2  3.5 - 5.1 (mEq/L)    Chloride 102  96 - 112 (mEq/L)    CO2 30  19 - 32 (mEq/L)    Glucose, Bld 97  70 - 99 (mg/dL)    BUN 14  6 - 23 (mg/dL)    Creatinine, Ser 2.95  0.50 - 1.35 (mg/dL)    Calcium 8.5  8.4 - 10.5 (mg/dL)    GFR calc non Af Amer >60  >60 (mL/min)    GFR calc Af Amer >60  >60 (mL/min)   CBC     Status: Abnormal   Collection Time   11/03/10  7:44 AM      Component Value Range Comment   WBC 12.4 (*) 4.0 - 10.5 (K/uL)    RBC 4.51  4.22 - 5.81 (MIL/uL)    Hemoglobin 14.7  13.0 - 17.0 (g/dL)    HCT 62.1  30.8 - 65.7 (%)    MCV 94.7  78.0 - 100.0 (fL)    MCH 32.6  26.0 - 34.0 (pg)    MCHC 34.4  30.0 - 36.0 (g/dL)    RDW 84.6  96.2 - 95.2 (%)    Platelets 186  150 -  400 (K/uL)   APTT     Status: Normal   Collection Time   11/03/10  7:44 AM      Component Value Range Comment   aPTT 30  24 - 37 (seconds)   PROTIME-INR     Status: Normal   Collection Time   11/03/10  7:44 AM      Component Value Range Comment   Prothrombin Time 13.8  11.6 - 15.2 (seconds)    INR 1.04  0.00 - 1.49    SURGICAL PCR SCREEN      Status: Normal   Collection Time   11/03/10  8:15 AM      Component Value Range Comment   MRSA, PCR NEGATIVE  NEGATIVE     Staphylococcus aureus NEGATIVE  NEGATIVE     Dg Chest 1 View  11/03/2010  *RADIOLOGY REPORT*  Clinical Data: Fall, ankle fracture.  CHEST - 1 VIEW  Comparison: 07/02/2010  Findings: Heart and mediastinal contours are within normal limits. No focal opacities or effusions.  No acute bony abnormality.  Mild peribronchial thickening.  IMPRESSION: Mild bronchitic changes.  Original Report Authenticated By: Cyndie Chime, M.D.   Dg Ankle 2 Views Right  11/03/2010  *RADIOLOGY REPORT*  Clinical Data: Post reduction right ankle  RIGHT ANKLE - 2 VIEW  Comparison: 11/03/2010  Findings: Overlying cast limits detailed evaluation.  There is interval improved positioning of the tibiotalar articulation. Fracture fragments are in improved alignment.  IMPRESSION: Post reduction radiograph demonstrates improved alignment.  Original Report Authenticated By: Waneta Martins, M.D.   Dg Ankle 2 Views Right  11/03/2010  *RADIOLOGY REPORT*  Clinical Data: Ankle pain, deformity, fall.  RIGHT ANKLE - 2 VIEW  Comparison: None.  Findings: Fracture dislocation at the right ankle.  Oblique, severely displaced fracture through the distal fibula.  Transverse fracture through the medial malleolus.  Suspect a posterior tibial fracture on the lateral view.  The tibia is dislocated anteriorly relative to the talus.  IMPRESSION: Trimalleolar fracture dislocation.  Original Report Authenticated By: Cyndie Chime, M.D.   Ct Head Wo Contrast  11/03/2010  *RADIOLOGY REPORT*  Clinical Data:  Fall, hit face.  CT HEAD WITHOUT CONTRAST CT MAXILLOFACIAL WITHOUT CONTRAST CT CERVICAL SPINE WITHOUT CONTRAST  Technique:  Multidetector CT imaging of the head, cervical spine, and maxillofacial structures were performed using the standard protocol without intravenous contrast. Multiplanar CT image reconstructions of the  cervical spine and maxillofacial structures were also generated.  Comparison:  07/30/2004  CT HEAD  Findings: No acute intracranial abnormality.  Specifically, no hemorrhage, hydrocephalus, mass lesion, acute infarction, or significant intracranial injury.  No acute calvarial abnormality. Visualized paranasal sinuses and mastoids clear.  Orbital soft tissues unremarkable.  IMPRESSION: No acute intracranial abnormality.  CT MAXILLOFACIAL  Findings:  Fracture through the anterior maxillary cortex overlying the ninth and tenth teeth.  These teeth are slightly displaced anteriorly.  No additional facial fracture noted.  Paranasal sinuses are clear.  Orbital soft tissues are unremarkable.  IMPRESSION: Fracture of the anterior cortex of the maxilla overlying the ninth and tenth teeth which are displaced anteriorly.  CT CERVICAL SPINE  Findings:   Normal alignment.  Prevertebral soft tissues are normal.  No fracture.  No epidural or paraspinal hematoma.  IMPRESSION: No acute findings.  Original Report Authenticated By: Cyndie Chime, M.D.   Ct Cervical Spine Wo Contrast  11/03/2010  *RADIOLOGY REPORT*  Clinical Data:  Fall, hit face.  CT HEAD WITHOUT CONTRAST CT MAXILLOFACIAL WITHOUT CONTRAST  CT CERVICAL SPINE WITHOUT CONTRAST  Technique:  Multidetector CT imaging of the head, cervical spine, and maxillofacial structures were performed using the standard protocol without intravenous contrast. Multiplanar CT image reconstructions of the cervical spine and maxillofacial structures were also generated.  Comparison:  07/30/2004  CT HEAD  Findings: No acute intracranial abnormality.  Specifically, no hemorrhage, hydrocephalus, mass lesion, acute infarction, or significant intracranial injury.  No acute calvarial abnormality. Visualized paranasal sinuses and mastoids clear.  Orbital soft tissues unremarkable.  IMPRESSION: No acute intracranial abnormality.  CT MAXILLOFACIAL  Findings:  Fracture through the anterior maxillary  cortex overlying the ninth and tenth teeth.  These teeth are slightly displaced anteriorly.  No additional facial fracture noted.  Paranasal sinuses are clear.  Orbital soft tissues are unremarkable.  IMPRESSION: Fracture of the anterior cortex of the maxilla overlying the ninth and tenth teeth which are displaced anteriorly.  CT CERVICAL SPINE  Findings:   Normal alignment.  Prevertebral soft tissues are normal.  No fracture.  No epidural or paraspinal hematoma.  IMPRESSION: No acute findings.  Original Report Authenticated By: Cyndie Chime, M.D.   Ct Maxillofacial Wo Cm  11/03/2010  *RADIOLOGY REPORT*  Clinical Data:  Fall, hit face.  CT HEAD WITHOUT CONTRAST CT MAXILLOFACIAL WITHOUT CONTRAST CT CERVICAL SPINE WITHOUT CONTRAST  Technique:  Multidetector CT imaging of the head, cervical spine, and maxillofacial structures were performed using the standard protocol without intravenous contrast. Multiplanar CT image reconstructions of the cervical spine and maxillofacial structures were also generated.  Comparison:  07/30/2004  CT HEAD  Findings: No acute intracranial abnormality.  Specifically, no hemorrhage, hydrocephalus, mass lesion, acute infarction, or significant intracranial injury.  No acute calvarial abnormality. Visualized paranasal sinuses and mastoids clear.  Orbital soft tissues unremarkable.  IMPRESSION: No acute intracranial abnormality.  CT MAXILLOFACIAL  Findings:  Fracture through the anterior maxillary cortex overlying the ninth and tenth teeth.  These teeth are slightly displaced anteriorly.  No additional facial fracture noted.  Paranasal sinuses are clear.  Orbital soft tissues are unremarkable.  IMPRESSION: Fracture of the anterior cortex of the maxilla overlying the ninth and tenth teeth which are displaced anteriorly.  CT CERVICAL SPINE  Findings:   Normal alignment.  Prevertebral soft tissues are normal.  No fracture.  No epidural or paraspinal hematoma.  IMPRESSION: No acute findings.   Original Report Authenticated By: Cyndie Chime, M.D.    Review of Systems  Constitutional: Negative for fever, chills and weight loss.  HENT: Negative for hearing loss.   Eyes: Negative for blurred vision.  Respiratory: Negative for cough.   Cardiovascular: Negative for chest pain.  Gastrointestinal: Negative for heartburn.  Genitourinary: Negative for dysuria.  Musculoskeletal: Positive for joint pain.  Skin: Negative for itching and rash.  Neurological: Negative for dizziness, tingling, sensory change, seizures and headaches.  Endo/Heme/Allergies: Does not bruise/bleed easily.  Psychiatric/Behavioral: Negative for depression.    Blood pressure 120/74, pulse 87, temperature 98 F (36.7 C), temperature source Oral, resp. rate 20, height 6' (1.829 m), weight 84.6 kg (186 lb 8.2 oz), SpO2 97.00%. Physical Exam  Constitutional: He is oriented to person, place, and time. He appears well-developed and well-nourished.  HENT:       Laceration   Dental injury  Eyes: Pupils are equal, round, and reactive to light.  Neck: Normal range of motion. Neck supple. No JVD present. No tracheal deviation present. No thyromegaly present.  Cardiovascular: Normal rate and regular rhythm.   Respiratory: Effort normal.  GI:  Soft.  Musculoskeletal:       Right shoulder: Normal.       Left shoulder: Normal.       Right hip: Normal.       Left hip: Normal.       Left ankle: Normal.       Feet:  Lymphadenopathy:    He has no cervical adenopathy.       Right cervical: No superficial cervical adenopathy present.      Left cervical: No superficial cervical adenopathy present.  Neurological: He is alert and oriented to person, place, and time. He exhibits normal muscle tone. Coordination normal.  Skin: Skin is warm and dry.  Psychiatric: He has a normal mood and affect. His behavior is normal. Judgment and thought content normal.     Assessment/Plan Closed trimalleolar fracture right ankle    Maxillofacial fracture and dental injury   Plan otif right ankle   Risks and benefits of surgery explained including alternative treatments, he has agreed to surgery       Fuller Canada 11/03/2010, 12:00 PM

## 2010-11-03 NOTE — ED Notes (Signed)
Family at bedside.  Antibiotic infusing without difficulty.

## 2010-11-04 MED ORDER — HYDROCODONE-ACETAMINOPHEN 10-325 MG PO TABS
1.0000 | ORAL_TABLET | ORAL | Status: DC
  Administered 2010-11-04 – 2010-11-06 (×12): 1 via ORAL
  Filled 2010-11-04 (×12): qty 1

## 2010-11-04 MED ORDER — IBUPROFEN 800 MG PO TABS
800.0000 mg | ORAL_TABLET | Freq: Four times a day (QID) | ORAL | Status: DC
  Administered 2010-11-04 – 2010-11-06 (×7): 800 mg via ORAL
  Filled 2010-11-04 (×8): qty 1

## 2010-11-04 MED ORDER — OCUVITE-LUTEIN PO CAPS
1.0000 | ORAL_CAPSULE | Freq: Every day | ORAL | Status: DC
  Administered 2010-11-04 – 2010-11-06 (×3): 1 via ORAL
  Filled 2010-11-04 (×4): qty 1

## 2010-11-04 NOTE — Progress Notes (Signed)
Physical Therapy Evaluation Patient Name: Pedro Gray Date: 11/04/2010 Problem List: There is no problem list on file for this patient.  Past Medical History:  Past Medical History  Diagnosis Date  . Anxiety    Past Surgical History: History reviewed. No pertinent past surgical history.  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 Producer, television/film/video: Awake/alert Overall Cognitive Status: Appears within functional limits for tasks assessed Orientation Level: Oriented X4 Sensation/Coordination Sensation Light Touch: Appears Intact Stereognosis: Not tested Hot/Cold: Not tested Proprioception: Not tested Coordination Gross Motor Movements are Fluid and Coordinated: Not tested Fine Motor Movements are Fluid and Coordinated: Not tested Extremity Assessment RUE Assessment RUE Assessment: Not tested LUE Assessment LUE Assessment: Not tested RLE Assessment RLE Assessment: Exceptions to [facility] (ankle not tested) LLE Assessment LLE Assessment: Within Functional Limits Mobility (including Balance) Bed Mobility Bed Mobility: Yes Supine to Sit: 6: Modified independent (Device/Increase time) (Pt had gotten himself up to a chair, explained it wasn't saf) Transfers Transfers: Yes Sit to Stand: 6: Modified independent (Device/Increase time) Stand to Sit: 6: Modified independent (Device/Increase time) Stand Pivot Transfers: 6: Modified  independent (Device/Increase time) Ambulation/Gait Ambulation/Gait: Yes Ambulation/Gait Assistance: 6: Modified independent (Device/Increase time) Ambulation Distance (Feet): 85 Feet Assistive device: Rolling walker Gait Pattern: Step-to pattern Gait velocity: slow Stairs: No Wheelchair Mobility Wheelchair Mobility: No  Posture/Postural Control Posture/Postural Control: No significant limitations Balance Balance Assessed: No Exercise     End of Session PT - End of Session Equipment Utilized During Treatment: Gait belt (rolling walker) Activity Tolerance: Patient tolerated treatment well Patient left: in chair;with call bell in reach Nurse Communication: Mobility status for ambulation General Behavior During Session: [facility] for tasks performed Cognition: [facility] for tasks performed PT Assessment/Plan/Recommendation PT Assessment Clinical Impression Statement: Pt  s/p ORIF with NWB status.  PT did well with walker will need gait training with crutches in am and stair instruction. PT Recommendation/Assessment: Patient will need skilled PT in the acute care venue PT Problem List: Decreased mobility;Decreased activity tolerance;Pain PT Therapy Diagnosis : Difficulty walking;Acute pain PT Plan PT Frequency: 7X/week PT Treatment/Interventions: DME instruction;Gait training PT Recommendation Follow Up Recommendations: None Equipment Recommended: None recommended by PT (will have crutches.) PT Goals  Acute Rehab PT Goals PT Goal Formulation: With patient Time For Goal Achievement: 2 days Pt will Ambulate: 51 - 150 feet;with modified independence;with crutches Pt will Go Up / Down Stairs: 1-2 stairs;with modified independence;with crutches RUSSELL,CINDY 11/04/2010, 12:25 PM

## 2010-11-04 NOTE — Progress Notes (Signed)
Subjective: 1 Day Post-Op Procedure(s) (LRB): OPEN REDUCTION INTERNAL FIXATION (ORIF) ANKLE FRACTURE (Right) Patient reports pain as severe.    Objective: Vital signs in last 24 hours: Temp:  [97.5 F (36.4 C)-100.4 F (38 C)] 99.7 F (37.6 C) (08/26 0600) Pulse Rate:  [65-94] 94  (08/26 0600) Resp:  [12-20] 20  (08/26 0600) BP: (105-131)/(62-78) 122/73 mmHg (08/26 0600) SpO2:  [94 %-100 %] 95 % (08/26 0600)  Intake/Output from previous day: 08/25 0701 - 08/26 0700 In: 3028 [P.O.:1320; I.V.:1608; IV Piggyback:100] Out: 2400 [Urine:2400] Intake/Output this shift: I/O this shift: In: 100 [P.O.:100] Out: -    Basename 11/03/10 0744  HGB 14.7    Basename 11/03/10 0744  WBC 12.4*  RBC 4.51  HCT 42.7  PLT 186    Basename 11/03/10 0744  NA 139  K 4.2  CL 102  CO2 30  BUN 14  CREATININE 0.89  GLUCOSE 97  CALCIUM 8.5    Basename 11/03/10 0744  LABPT --  INR 1.04    Neurologically intact Neurovascular intact Sensation intact distally Intact pulses distally  Assessment/Plan: 1 Day Post-Op Procedure(s) (LRB): OPEN REDUCTION INTERNAL FIXATION (ORIF) ANKLE FRACTURE (Right) Advance diet Up with therapy D/C IV fluids  Pedro Gray 11/04/2010, 10:21 AM

## 2010-11-04 NOTE — Patient Instructions (Addendum)
Instructed in NWB Gt with walker.  Pt instructed in home safety with removal of throw rugs, keeping pets at bay and all clutter out of pathway.  He was instructed in finer points of NWB gait with crutches

## 2010-11-05 ENCOUNTER — Encounter: Payer: Self-pay | Admitting: Orthopedic Surgery

## 2010-11-05 LAB — GLUCOSE, CAPILLARY
Glucose-Capillary: 114 mg/dL — ABNORMAL HIGH (ref 70–99)
Glucose-Capillary: 227 mg/dL — ABNORMAL HIGH (ref 70–99)
Glucose-Capillary: 101 mg/dL — ABNORMAL HIGH (ref 70–99)

## 2010-11-05 NOTE — Progress Notes (Signed)
Physical Therapy Treatment Patient Name: Pedro Gray ZOXWR'U Date: 11/05/2010 Problem List: There is no problem list on file for this patient.  Past Medical History:  Past Medical History  Diagnosis Date  . Anxiety    Past Surgical History: History reviewed. No pertinent past surgical history. Precautions/Restrictions  Precautions Precautions: Fall Restrictions Weight Bearing Restrictions: No RLE Weight Bearing: Non weight bearing Mobility (including Balance) Bed Mobility Supine to Sit: 6: Modified independent (Device/Increase time) Transfers Sit to Stand: 6: Modified independent (Device/Increase time) Stand to Sit: 6: Modified independent (Device/Increase time) Stand Pivot Transfers: 6: Modified independent (Device/Increase time) Ambulation/Gait Ambulation/Gait Assistance: 6: Modified independent (Device/Increase time) (No LOB) Ambulation/Gait Assistance Details (indicate cue type and reason): crutch hand grips were readjusted for a better fit Ambulation Distance (Feet): 125 Feet Assistive device: Crutches Gait Pattern: Step-to pattern;Within Functional Limits (cues for increasing crutch BOS) Stairs: No Stairs Assistance:  (pt declines) Naval architect Mobility: No    Exercise     End of Session PT - End of Session Equipment Utilized During Treatment: Gait belt Activity Tolerance: Patient tolerated treatment well Patient left: in bed General Behavior During Session: [facility] for tasks performed Cognition: [facility] for tasks performed PT Assessment/Plan  PT - Assessment/Plan Comments on Treatment Session: more stable with crutches this afternoon-he tires easily for his age, but will certainly be functional within the home PT Plan: Discharge plan remains appropriate PT Goals  Acute Rehab PT Goals PT Goal: Ambulate - Progress: Met PT Goal: Up/Down Stairs - Progress: Partly met  Konrad Penta 11/05/2010, 4:35 PM

## 2010-11-05 NOTE — Progress Notes (Signed)
Physical Therapy Treatment Patient Name: Pedro Gray ZOXWR'U Date: 11/05/2010  TIME:1000-1020 CHARGES: 2 GT Problem List: There is no problem list on file for this patient.  Past Medical History:  Past Medical History  Diagnosis Date  . Anxiety    Past Surgical History: History reviewed. No pertinent past surgical history. Precautions/Restrictions  Precautions Precautions: Fall Restrictions Weight Bearing Restrictions: No RLE Weight Bearing: Non weight bearing Mobility (including Balance) Transfers Sit to Stand: 6: Modified independent (Device/Increase time) Stand to Sit: 6: Modified independent (Device/Increase time) Ambulation/Gait Ambulation/Gait: Yes Ambulation/Gait Assistance: 4: Min assist Ambulation/Gait Assistance Details (indicate cue type and reason): crutches;LOBx3 during sit<>stand<>sit due to impulsiveness of pt Ambulation Distance (Feet): 100 Feet Assistive device: Crutches Gait Pattern: Within Functional Limits Gait velocity: WFL Stairs: Yes Stairs Assistance: 4: Min assist Stairs Assistance Details (indicate cue type and reason): with rail;no LOB  Stair Management Technique: One rail Right;One rail Left;With crutches Number of Stairs: 3  Wheelchair Mobility Wheelchair Mobility: No    Exercise     End of Session PT - End of Session Equipment Utilized During Treatment: Gait belt (crutches) Activity Tolerance: Patient tolerated treatment well Patient left: in chair;with call bell in reach (nursing ok'd pt to be left in chair) General Behavior During Session: [facility] for tasks performed Cognition: [facility] for tasks performed PT Assessment/Plan  PT - Assessment/Plan Comments on Treatment Session: pt tolerated therapy well;pt tends to be a bit impulsive with sit<>stand<>sit which caused him several LOB today;educated pt on slowing down and taking time to avoid a fall PT Goals  Acute Rehab PT Goals PT Goal: Ambulate - Progress: Progressing toward goal PT  Goal: Up/Down Stairs - Progress: Progressing toward goal  Pedro Gray 11/05/2010, 10:33 AM

## 2010-11-05 NOTE — Progress Notes (Signed)
Subjective: 2 Days Post-Op Procedure(s) (LRB): OPEN REDUCTION INTERNAL FIXATION (ORIF) ANKLE FRACTURE (Right) Patient reports pain as moderate.    Objective: Vital signs in last 24 hours: Temp:  [97.2 F (36.2 C)-100.3 F (37.9 C)] 97.9 F (36.6 C) (08/27 0600) Pulse Rate:  [65-88] 69  (08/27 0600) Resp:  [18-20] 20  (08/27 0600) BP: (98-125)/(51-71) 112/67 mmHg (08/27 0600) SpO2:  [92 %-99 %] 98 % (08/27 0600)  Intake/Output from previous day: 08/26 0701 - 08/27 0700 In: 2910 [P.O.:1780; I.V.:1130] Out: 1650 [Urine:1650] Intake/Output this shift: I/O this shift: In: 2090 [P.O.:960; I.V.:1130] Out: 1650 [Urine:1650]   Basename 11/03/10 0744  HGB 14.7    Basename 11/03/10 0744  WBC 12.4*  RBC 4.51  HCT 42.7  PLT 186    Basename 11/03/10 0744  NA 139  K 4.2  CL 102  CO2 30  BUN 14  CREATININE 0.89  GLUCOSE 97  CALCIUM 8.5    Basename 11/03/10 0744  LABPT --  INR 1.04    asleep  Assessment/Plan: 2 Days Post-Op Procedure(s) (LRB): OPEN REDUCTION INTERNAL FIXATION (ORIF) ANKLE FRACTURE (Right) Up with therapy Plan for discharge tomorrow  Fuller Canada 11/05/2010, 7:00 AM

## 2010-11-06 DIAGNOSIS — S82899A Other fracture of unspecified lower leg, initial encounter for closed fracture: Secondary | ICD-10-CM | POA: Diagnosis present

## 2010-11-06 MED ORDER — HYDROCODONE-ACETAMINOPHEN 10-325 MG PO TABS: 1.0000 | ORAL_TABLET | ORAL | Status: AC | PRN

## 2010-11-06 NOTE — Progress Notes (Signed)
Pt discharge instructions, medications and follow up appts discussed with pt.  No issues or concerns stated. Pt discharged at this time.  Pincus Badder, RN

## 2010-11-06 NOTE — Progress Notes (Signed)
Subjective: 3 Days Post-Op Procedure(s) (LRB): OPEN REDUCTION INTERNAL FIXATION (ORIF) ANKLE FRACTURE (Right) Patient reports pain as moderate.    Objective: Vital signs in last 24 hours: Temp:  [97.7 F (36.5 C)-100 F (37.8 C)] 100 F (37.8 C) (08/28 0530) Pulse Rate:  [64-83] 83  (08/28 0530) Resp:  [16-20] 16  (08/28 0530) BP: (106-122)/(52-68) 122/68 mmHg (08/28 0530) SpO2:  [96 %-99 %] 99 % (08/28 0530)  Intake/Output from previous day: 08/27 0701 - 08/28 0700 In: 360 [P.O.:360] Out: 500 [Urine:500] Intake/Output this shift:     Basename 11/03/10 0744  HGB 14.7    Basename 11/03/10 0744  WBC 12.4*  RBC 4.51  HCT 42.7  PLT 186    Basename 11/03/10 0744  NA 139  K 4.2  CL 102  CO2 30  BUN 14  CREATININE 0.89  GLUCOSE 97  CALCIUM 8.5    Basename 11/03/10 0744  LABPT --  INR 1.04    see discharge summary   Assessment/Plan: 3 Days Post-Op Procedure(s) (LRB): OPEN REDUCTION INTERNAL FIXATION (ORIF) ANKLE FRACTURE (Right) Plan discharge  Fuller Canada 11/06/2010, 7:29 AM

## 2010-11-06 NOTE — Discharge Summary (Signed)
Physician Discharge Summary  Patient ID: Pedro Gray MRN: [id number] DOB/AGE: September 15, 1975 35 y.o.  Admit date: 11/03/2010 Discharge date: 11/06/2010  Admission Diagnoses:Trimalleolar fracture right ankle                                         Fracture maxilla  Discharge Diagnoses: Same Active Problems:  Unspecified closed fracture of ankle  Triplanar ankle fracture, closed Maxilla fracture   Discharged Condition: good  Hospital Course: Admitted 8/25 underwent surgery 8/26 then had PT and pain medication for pain control, tolerated that well and was stable for discharge.  Consults: none  Significant Diagnostic Studies: none   Treatments: OTIF righ tankle with Synthes plate and screws   Discharge Exam: Blood pressure 122/68, pulse 83, temperature 100 F (37.8 C), temperature source Oral, resp. rate 16, height 6' (1.829 m), weight 84.6 kg (186 lb 8.2 oz), SpO2 99.00%. splint intact vascularity intact   Disposition: Home or Self Care  Discharge Orders    Future Orders Please Complete By Expires   Diet general      Call MD / Call 911      Comments:   If you experience chest pain or shortness of breath, CALL 911 and be transported to the hospital emergency room.  If you develope a fever above 101 F, pus (white drainage) or increased drainage or redness at the wound, or calf pain, call your surgeon's office.   Constipation Prevention      Comments:   Drink plenty of fluids.  Prune juice may be helpful.  You may use a stool softener, such as Colace (over the counter) 100 mg twice a day.  Use MiraLax (over the counter) for constipation as needed.   Increase activity slowly as tolerated      Weight Bearing as taught in Physical Therapy      Comments:   Use a walker or crutches as instructed.   Discharge instructions      Comments:   Keep  Cast dry   Do not get wet   If it gets wet dry with a hair dryer on low setting and call the office     Lifting restrictions      Comments:   No lifting for 12 weeks     Current Discharge Medication List    START taking these medications   Details  HYDROcodone-acetaminophen (NORCO) 10-325 MG per tablet Take 1 tablet by mouth every 4 (four) hours as needed for pain. Qty: 42 tablet, Refills: 1      CONTINUE these medications which have NOT CHANGED   Details  alprazolam (XANAX) 2 MG tablet Take 2 mg by mouth 3 (three) times daily as needed. Anxiety    methylphenidate (RITALIN) 10 MG tablet Take 10 mg by mouth 2 (two) times daily.      mirtazapine (REMERON) 45 MG tablet Take 45 mg by mouth at bedtime as needed. Sleep     traZODone (DESYREL) 150 MG tablet Take 150 mg by mouth 3 times/day as needed-between meals & bedtime. Sleep       STOP taking these medications     acetaminophen (TYLENOL) 500 MG tablet        Follow-up Information    Follow up with Fuller Canada, MD. Make an appointment on 11/15/2010. (call office to get time )    Contact information:   [address] Marcene Corning Senaida Ores  [address] [place] [address] [phone]          Signed: Fuller Canada 11/06/2010, 7:35 AM

## 2010-11-08 ENCOUNTER — Encounter (HOSPITAL_COMMUNITY): Payer: Self-pay | Admitting: Orthopedic Surgery

## 2010-11-15 ENCOUNTER — Ambulatory Visit (INDEPENDENT_AMBULATORY_CARE_PROVIDER_SITE_OTHER): Payer: Self-pay | Admitting: Orthopedic Surgery

## 2010-11-15 DIAGNOSIS — S82853A Displaced trimalleolar fracture of unspecified lower leg, initial encounter for closed fracture: Secondary | ICD-10-CM

## 2010-11-15 DIAGNOSIS — S82851A Displaced trimalleolar fracture of right lower leg, initial encounter for closed fracture: Secondary | ICD-10-CM

## 2010-11-15 NOTE — Patient Instructions (Addendum)
Keep  Cast dry   Do not get wet   If it gets wet dry with a hair dryer on low setting and call the office   No weight on right foot when walking

## 2010-11-15 NOTE — Progress Notes (Signed)
Postoperative Day # 11 Postop visit #1  Date of injury August 25  Open treatment internal fixation of the RIGHT ankle.  Today the patient is scheduled for splint change, suture / staple removal  The patient had open reduction internal fixation RIGHT ankle with a Synthes lateral plate and screws including interfragmentary screw and 2 K wires on the medial side.  Date of surgery was August 25  Today his splint was changed he has some fracture blisters which have already healed his skin looks good sutures and staples were removed incisions are clean  He was placed in a neutral nonweightbearing cast  Follow-up 4 weeks x-ray out of plaster consider brace or cast depending on x-rays

## 2010-11-19 NOTE — Progress Notes (Signed)
Encounter addended by: Clarene Critchley on: 11/19/2010 12:11 PM<BR>     Documentation filed: Flowsheet VN

## 2010-11-21 ENCOUNTER — Other Ambulatory Visit: Payer: Self-pay | Admitting: *Deleted

## 2010-11-21 DIAGNOSIS — M25579 Pain in unspecified ankle and joints of unspecified foot: Secondary | ICD-10-CM

## 2010-11-21 MED ORDER — HYDROCODONE-ACETAMINOPHEN 10-325 MG PO TABS: 1.0000 | ORAL_TABLET | ORAL | Status: AC | PRN

## 2010-12-02 ENCOUNTER — Emergency Department (HOSPITAL_COMMUNITY)
Admission: EM | Admit: 2010-12-02 | Discharge: 2010-12-02 | Disposition: A | Discharge status: Home / Self Care | Payer: Self-pay | Attending: Emergency Medicine | Admitting: Emergency Medicine

## 2010-12-02 ENCOUNTER — Encounter (HOSPITAL_COMMUNITY): Payer: Self-pay

## 2010-12-02 ENCOUNTER — Emergency Department (HOSPITAL_COMMUNITY): Payer: Self-pay

## 2010-12-02 DIAGNOSIS — F17200 Nicotine dependence, unspecified, uncomplicated: Secondary | ICD-10-CM | POA: Insufficient documentation

## 2010-12-02 DIAGNOSIS — W19XXXA Unspecified fall, initial encounter: Secondary | ICD-10-CM | POA: Insufficient documentation

## 2010-12-02 DIAGNOSIS — S9000XA Contusion of unspecified ankle, initial encounter: Secondary | ICD-10-CM | POA: Insufficient documentation

## 2010-12-02 HISTORY — DX: Other fracture of unspecified lower leg, initial encounter for closed fracture: S82.899A

## 2010-12-02 MED ORDER — PROMETHAZINE HCL 12.5 MG PO TABS
12.5000 mg | ORAL_TABLET | Freq: Once | ORAL | Status: AC
Start: 2010-12-02 — End: 2010-12-02
  Administered 2010-12-02: 12.5 mg via ORAL
  Filled 2010-12-02: qty 1

## 2010-12-02 MED ORDER — IBUPROFEN 800 MG PO TABS
800.0000 mg | ORAL_TABLET | Freq: Once | ORAL | Status: AC
  Administered 2010-12-02: 800 mg via ORAL
  Filled 2010-12-02: qty 1

## 2010-12-02 MED ORDER — ACETAMINOPHEN-CODEINE #3 300-30 MG PO TABS
1.0000 | ORAL_TABLET | Freq: Once | ORAL | Status: AC
  Administered 2010-12-02: 1 via ORAL
  Filled 2010-12-02: qty 1

## 2010-12-02 NOTE — ED Provider Notes (Signed)
History     CSN: [id number] Arrival date & time: 12/02/2010  8:51 PM  Chief Complaint  Patient presents with  . Fall    HPI  (Consider location/radiation/quality/duration/timing/severity/associated sxs/prior treatment)  HPI Comments: Pt sustained trimalleolar fracture of the right ankle 8/24. He underwent ORIF by Dr Kathie Rhodes. Romeo Apple. He is in a cast. Today he fell going to the BR and request to be evaluated for reinjury of the ankle.  Patient is a 35 y.o. male presenting with fall. The history is provided by the patient.  Fall The accident occurred 6 to 12 hours ago. Incident: Going to the bathroom. He landed on a hard floor. Pain location: right leg. The pain is severe. Pertinent negatives include no visual change, no numbness, no abdominal pain, no bowel incontinence, no vomiting, no hematuria and no loss of consciousness. The symptoms are aggravated by activity. He has tried nothing for the symptoms.    Past Medical History  Diagnosis Date  . Anxiety   . Ankle fracture   . Gastric ulcer     Past Surgical History  Procedure Date  . Orif ankle fracture 11/03/2010    Procedure: OPEN REDUCTION INTERNAL FIXATION (ORIF) ANKLE FRACTURE;  Surgeon: Fuller Canada, MD;  Location: [facility];  Service: Orthopedics;  Laterality: Right;  with Synthes Small Fragment Plates and Screws    No family history on file.  History  Substance Use Topics  . Smoking status: Current Everyday Smoker  . Smokeless tobacco: Not on file  . Alcohol Use: 1.8 oz/week    3 Cans of beer per week     3-24 oz beers      Review of Systems  Review of Systems  Constitutional: Negative for activity change.       All ROS Neg except as noted in HPI  HENT: Negative for nosebleeds and neck pain.   Eyes: Negative for photophobia and discharge.  Respiratory: Negative for cough, shortness of breath and wheezing.   Cardiovascular: Negative for chest pain and palpitations.  Gastrointestinal: Negative for vomiting,  abdominal pain, blood in stool and bowel incontinence.  Genitourinary: Negative for dysuria, frequency and hematuria.  Musculoskeletal: Negative for back pain and arthralgias.  Skin: Negative.   Neurological: Negative for dizziness, seizures, loss of consciousness, speech difficulty and numbness.  Psychiatric/Behavioral: Negative for hallucinations and confusion.    Allergies  Review of patient's allergies indicates no known allergies.  Home Medications   Current Outpatient Rx  Name Route Sig Dispense Refill  . ALPRAZOLAM 2 MG PO TABS Oral Take 2 mg by mouth 3 (three) times daily as needed. Anxiety    . HYDROCODONE-ACETAMINOPHEN 10-325 MG PO TABS Oral Take 1 tablet by mouth every 4 (four) hours as needed for pain. 42 tablet 5  . IBUPROFEN 200 MG PO TABS Oral Take 200 mg by mouth every 8 (eight) hours as needed. Taking 3 tablets bid     . METHYLPHENIDATE HCL 10 MG PO TABS Oral Take 10 mg by mouth 2 (two) times daily.      Marland Kitchen MIRTAZAPINE 45 MG PO TABS Oral Take 45 mg by mouth at bedtime as needed. Sleep     . TRAZODONE HCL 150 MG PO TABS Oral Take 150 mg by mouth 3 times/day as needed-between meals & bedtime. Sleep       Physical Exam    BP 118/74  Pulse 98  Temp(Src) 98.5 F (36.9 C) (Oral)  Resp 20  Ht 6' (1.829 m)  Wt 175 lb (79.379  kg)  BMI 23.73 kg/m2  SpO2 100%  Physical Exam  Nursing note and vitals reviewed. Constitutional: He is oriented to person, place, and time. He appears well-developed and well-nourished.  Non-toxic appearance. He appears distressed.  HENT:  Head: Normocephalic.  Right Ear: Tympanic membrane and external ear normal.  Left Ear: Tympanic membrane and external ear normal.  Eyes: EOM and lids are normal. Pupils are equal, round, and reactive to light.  Neck: Normal range of motion. Neck supple. Carotid bruit is not present.  Cardiovascular: Normal rate, regular rhythm, normal heart sounds, intact distal pulses and normal pulses.   Pulmonary/Chest:  Breath sounds normal. No respiratory distress.  Abdominal: Soft. Bowel sounds are normal. There is no tenderness. There is no guarding.  Musculoskeletal: Normal range of motion.       Cast to the RLE intact. Good cap refill of the toes. DP wnl. No hematoma at the top of the cast. No pain to percussion of the tibia. No knee effusion or bruising. Pt c/o pain at the lateral malleolus area.  Lymphadenopathy:       Head (right side): No submandibular adenopathy present.       Head (left side): No submandibular adenopathy present.    He has no cervical adenopathy.  Neurological: He is alert and oriented to person, place, and time. He has normal strength. No cranial nerve deficit or sensory deficit.  Skin: Skin is warm and dry.  Psychiatric: He has a normal mood and affect. His speech is normal.    ED Course: There is a delay in obtaining results of the xray. Pt states the initial tylenol #3 is not working. Will give a second dose. Test results given to pt. Pt to see Dr Romeo Apple for additonal evaluation and management.  Procedures (including critical care time)  Labs Reviewed - No data to display Dg Chest 1 View  11/03/2010  *RADIOLOGY REPORT*  Clinical Data: Fall, ankle fracture.  CHEST - 1 VIEW  Comparison: 07/02/2010  Findings: Heart and mediastinal contours are within normal limits. No focal opacities or effusions.  No acute bony abnormality.  Mild peribronchial thickening.  IMPRESSION: Mild bronchitic changes.  Original Report Authenticated By: Cyndie Chime, M.D.   Dg Ankle 2 Views Right  11/04/2010  *RADIOLOGY REPORT*  Clinical Data: Right ankle trimalleolar fracture.  RIGHT ANKLE - 2 VIEW  Comparison: Earlier on the same date.  Findings: Eight C-arm views of the right ankle demonstrate screw and plate fixation of the previously demonstrated lateral malleolus fracture and K-wire fixation of the previously demonstrated medial malleolus fracture.  Anatomic position and alignment is demonstrated  on these views.  There is mild proximal displacement of the posterior fragment of the posterior malleolus fracture.  IMPRESSION:  1.  Hardware fixation of the previously demonstrated medial and lateral malleolar fractures with anatomic position and alignment. 2.  Mild proximal displacement of the posterior fragment of the posterior malleolus fracture.  Original Report Authenticated By: Darrol Angel, M.D.   Dg Ankle 2 Views Right  11/03/2010  *RADIOLOGY REPORT*  Clinical Data: Post reduction right ankle  RIGHT ANKLE - 2 VIEW  Comparison: 11/03/2010  Findings: Overlying cast limits detailed evaluation.  There is interval improved positioning of the tibiotalar articulation. Fracture fragments are in improved alignment.  IMPRESSION: Post reduction radiograph demonstrates improved alignment.  Original Report Authenticated By: Waneta Martins, M.D.   Dg Ankle 2 Views Right  11/03/2010  *RADIOLOGY REPORT*  Clinical Data: Ankle pain, deformity, fall.  RIGHT ANKLE - 2 VIEW  Comparison: None.  Findings: Fracture dislocation at the right ankle.  Oblique, severely displaced fracture through the distal fibula.  Transverse fracture through the medial malleolus.  Suspect a posterior tibial fracture on the lateral view.  The tibia is dislocated anteriorly relative to the talus.  IMPRESSION: Trimalleolar fracture dislocation.  Original Report Authenticated By: Cyndie Chime, M.D.   Ct Head Wo Contrast  11/03/2010  *RADIOLOGY REPORT*  Clinical Data:  Fall, hit face.  CT HEAD WITHOUT CONTRAST CT MAXILLOFACIAL WITHOUT CONTRAST CT CERVICAL SPINE WITHOUT CONTRAST  Technique:  Multidetector CT imaging of the head, cervical spine, and maxillofacial structures were performed using the standard protocol without intravenous contrast. Multiplanar CT image reconstructions of the cervical spine and maxillofacial structures were also generated.  Comparison:  07/30/2004  CT HEAD  Findings: No acute intracranial abnormality.   Specifically, no hemorrhage, hydrocephalus, mass lesion, acute infarction, or significant intracranial injury.  No acute calvarial abnormality. Visualized paranasal sinuses and mastoids clear.  Orbital soft tissues unremarkable.  IMPRESSION: No acute intracranial abnormality.  CT MAXILLOFACIAL  Findings:  Fracture through the anterior maxillary cortex overlying the ninth and tenth teeth.  These teeth are slightly displaced anteriorly.  No additional facial fracture noted.  Paranasal sinuses are clear.  Orbital soft tissues are unremarkable.  IMPRESSION: Fracture of the anterior cortex of the maxilla overlying the ninth and tenth teeth which are displaced anteriorly.  CT CERVICAL SPINE  Findings:   Normal alignment.  Prevertebral soft tissues are normal.  No fracture.  No epidural or paraspinal hematoma.  IMPRESSION: No acute findings.  Original Report Authenticated By: Cyndie Chime, M.D.   Ct Cervical Spine Wo Contrast  11/03/2010  *RADIOLOGY REPORT*  Clinical Data:  Fall, hit face.  CT HEAD WITHOUT CONTRAST CT MAXILLOFACIAL WITHOUT CONTRAST CT CERVICAL SPINE WITHOUT CONTRAST  Technique:  Multidetector CT imaging of the head, cervical spine, and maxillofacial structures were performed using the standard protocol without intravenous contrast. Multiplanar CT image reconstructions of the cervical spine and maxillofacial structures were also generated.  Comparison:  07/30/2004  CT HEAD  Findings: No acute intracranial abnormality.  Specifically, no hemorrhage, hydrocephalus, mass lesion, acute infarction, or significant intracranial injury.  No acute calvarial abnormality. Visualized paranasal sinuses and mastoids clear.  Orbital soft tissues unremarkable.  IMPRESSION: No acute intracranial abnormality.  CT MAXILLOFACIAL  Findings:  Fracture through the anterior maxillary cortex overlying the ninth and tenth teeth.  These teeth are slightly displaced anteriorly.  No additional facial fracture noted.  Paranasal  sinuses are clear.  Orbital soft tissues are unremarkable.  IMPRESSION: Fracture of the anterior cortex of the maxilla overlying the ninth and tenth teeth which are displaced anteriorly.  CT CERVICAL SPINE  Findings:   Normal alignment.  Prevertebral soft tissues are normal.  No fracture.  No epidural or paraspinal hematoma.  IMPRESSION: No acute findings.  Original Report Authenticated By: Cyndie Chime, M.D.   Dg C-arm 1-60 Min-no Report  11/03/2010  CLINICAL DATA: fracture right ankle   C-ARM 1-60 MINUTES  Fluoroscopy was utilized by the requesting physician.  No radiographic  interpretation.     Ct Maxillofacial Wo Cm  11/03/2010  *RADIOLOGY REPORT*  Clinical Data:  Fall, hit face.  CT HEAD WITHOUT CONTRAST CT MAXILLOFACIAL WITHOUT CONTRAST CT CERVICAL SPINE WITHOUT CONTRAST  Technique:  Multidetector CT imaging of the head, cervical spine, and maxillofacial structures were performed using the standard protocol without intravenous contrast. Multiplanar CT image reconstructions of  the cervical spine and maxillofacial structures were also generated.  Comparison:  07/30/2004  CT HEAD  Findings: No acute intracranial abnormality.  Specifically, no hemorrhage, hydrocephalus, mass lesion, acute infarction, or significant intracranial injury.  No acute calvarial abnormality. Visualized paranasal sinuses and mastoids clear.  Orbital soft tissues unremarkable.  IMPRESSION: No acute intracranial abnormality.  CT MAXILLOFACIAL  Findings:  Fracture through the anterior maxillary cortex overlying the ninth and tenth teeth.  These teeth are slightly displaced anteriorly.  No additional facial fracture noted.  Paranasal sinuses are clear.  Orbital soft tissues are unremarkable.  IMPRESSION: Fracture of the anterior cortex of the maxilla overlying the ninth and tenth teeth which are displaced anteriorly.  CT CERVICAL SPINE  Findings:   Normal alignment.  Prevertebral soft tissues are normal.  No fracture.  No epidural or  paraspinal hematoma.  IMPRESSION: No acute findings.  Original Report Authenticated By: Cyndie Chime, M.D.     Dx: 1. Contusion of the right ankle.  MDM I have reviewed nursing notes, vital signs, and all appropriate lab and imaging results for this patient.        Kathie Dike, [place] 12/02/10 [id number]

## 2010-12-02 NOTE — ED Notes (Signed)
Pt returned from radiology demanding to speak with DR.  Pt instructed Dr was with another pt and as soon as his x-ray was read he would be in to see him.  Pt began yelling at RN in regards to medication choice that was given to him. States " I'M not here for the drugs. I fell on my broke leg, broke in 3 places and you give me tylenol #3 and f in motrin".

## 2010-12-02 NOTE — ED Notes (Signed)
Pt transported to X-ray via wheelchair.

## 2010-12-02 NOTE — ED Notes (Addendum)
Pt outside of room, yelling for Dr to see him as he is in a lot of pain. Pt states fell while going to bathroom.  Pt has rt full foot and lower leg cast on.  Cast is noted to have heel broke/worn a hole in it.  Pt states does not use crutches, doesn't like them. Explained the importance of non weight bearing cast. Pt began to swear at me stating "I don't give a fuck what you think or say". Pt was instructed not to speak to RN that way and was left on stretcher in lowest position, call light in reach and RN left room. Pt smells of alcohol. But denies drinking Stating " That's not how I got hurt"!

## 2010-12-02 NOTE — ED Notes (Signed)
Larey Seat today going to bathroom, thinks he re-injured his right leg, which is in cast, also out of meds for pain--ran out yesterday--hydrocodone 10's, "but they weren't really helping"

## 2010-12-03 ENCOUNTER — Telehealth: Payer: Self-pay | Admitting: *Deleted

## 2010-12-03 NOTE — Telephone Encounter (Signed)
Wanted pain med 2 days early, denied

## 2010-12-13 ENCOUNTER — Encounter: Payer: Self-pay | Admitting: Orthopedic Surgery

## 2010-12-13 ENCOUNTER — Ambulatory Visit (INDEPENDENT_AMBULATORY_CARE_PROVIDER_SITE_OTHER): Payer: Self-pay | Admitting: Orthopedic Surgery

## 2010-12-13 VITALS — BP 104/70 | Ht 72.0 in | Wt 175.0 lb

## 2010-12-13 DIAGNOSIS — S82899A Other fracture of unspecified lower leg, initial encounter for closed fracture: Secondary | ICD-10-CM

## 2010-12-13 NOTE — Progress Notes (Signed)
   6 week followup ankle fracture OT IF trimalleolar fracture  Casted.  Cast off x-rays today  X-ray show fracture in excellent alignment healing progressing towards union  Patient placed in an Aircast weightbearing as tolerated  Separate x-ray report 3 views RIGHT ankle Lateral plate with medial pins an interfrag screw.  Alignment is normal mortise is normal  Impression stable fixation RIGHT ankle

## 2010-12-13 NOTE — Patient Instructions (Signed)
Air cast for all walking   Stop crutches when comfortable   Apply weight as tolerated to the foot

## 2010-12-28 NOTE — ED Provider Notes (Signed)
Evaluation and management procedures were performed by the PA/NP under my supervision/collaboration.    Felisa Bonier, MD 12/28/10 [phone]

## 2011-01-02 ENCOUNTER — Other Ambulatory Visit: Payer: Self-pay | Admitting: *Deleted

## 2011-01-02 MED ORDER — HYDROCODONE-ACETAMINOPHEN 7.5-325 MG PO TABS: 1.0000 | ORAL_TABLET | ORAL | Status: DC | PRN

## 2011-01-14 ENCOUNTER — Other Ambulatory Visit: Payer: Self-pay | Admitting: *Deleted

## 2011-01-14 MED ORDER — HYDROCODONE-ACETAMINOPHEN 7.5-325 MG PO TABS: 1.0000 | ORAL_TABLET | ORAL | Status: DC | PRN

## 2011-01-22 ENCOUNTER — Other Ambulatory Visit: Payer: Self-pay | Admitting: *Deleted

## 2011-01-22 MED ORDER — HYDROCODONE-ACETAMINOPHEN 7.5-325 MG PO TABS: 1.0000 | ORAL_TABLET | ORAL | Status: AC | PRN

## 2011-01-24 ENCOUNTER — Ambulatory Visit (INDEPENDENT_AMBULATORY_CARE_PROVIDER_SITE_OTHER): Payer: Self-pay | Admitting: Orthopedic Surgery

## 2011-01-24 ENCOUNTER — Encounter: Payer: Self-pay | Admitting: Orthopedic Surgery

## 2011-01-24 VITALS — BP 120/90 | Ht 72.0 in | Wt 179.0 lb

## 2011-01-24 DIAGNOSIS — S82851A Displaced trimalleolar fracture of right lower leg, initial encounter for closed fracture: Secondary | ICD-10-CM

## 2011-01-24 DIAGNOSIS — S82853A Displaced trimalleolar fracture of unspecified lower leg, initial encounter for closed fracture: Secondary | ICD-10-CM

## 2011-01-24 NOTE — Patient Instructions (Signed)
Brace until Sunday

## 2011-01-24 NOTE — Progress Notes (Signed)
Ankle fracture OT IF trimalleolar fracture  DOS 11-03-2010 INITIALLY CASTED  Cast off x-rays last time  Patient placed in an Aircast weightbearing as tolerated   Separate x-ray report 3 views RIGHT ankle  Lateral plate with medial pins an interfrag screw. Alignment is normal mortise is normal  Impression stable fixation RIGHT ankle  Incisions are healed   C/o numb dorsal foot   rom is 10 df 15 pf   Plan Remove brace at the end of this week in which will be 12 weeks I refilled his medication this week.  Follow up as needed activities as tolerated

## 2011-02-08 ENCOUNTER — Other Ambulatory Visit: Payer: Self-pay | Admitting: Orthopedic Surgery

## 2011-07-04 ENCOUNTER — Emergency Department (HOSPITAL_COMMUNITY)
Admission: EM | Admit: 2011-07-04 | Discharge: 2011-07-04 | Disposition: A | Discharge status: Home / Self Care | Payer: Self-pay | Attending: Emergency Medicine | Admitting: Emergency Medicine

## 2011-07-04 ENCOUNTER — Encounter (HOSPITAL_COMMUNITY): Payer: Self-pay | Admitting: Emergency Medicine

## 2011-07-04 ENCOUNTER — Emergency Department (HOSPITAL_COMMUNITY): Payer: Self-pay

## 2011-07-04 DIAGNOSIS — W108XXA Fall (on) (from) other stairs and steps, initial encounter: Secondary | ICD-10-CM | POA: Insufficient documentation

## 2011-07-04 DIAGNOSIS — S93401A Sprain of unspecified ligament of right ankle, initial encounter: Secondary | ICD-10-CM

## 2011-07-04 DIAGNOSIS — Y92009 Unspecified place in unspecified non-institutional (private) residence as the place of occurrence of the external cause: Secondary | ICD-10-CM | POA: Insufficient documentation

## 2011-07-04 DIAGNOSIS — S93409A Sprain of unspecified ligament of unspecified ankle, initial encounter: Secondary | ICD-10-CM | POA: Insufficient documentation

## 2011-07-04 MED ORDER — HYDROCODONE-ACETAMINOPHEN 5-325 MG PO TABS
1.0000 | ORAL_TABLET | Freq: Once | ORAL | Status: AC
  Administered 2011-07-04: 1 via ORAL
  Filled 2011-07-04: qty 1

## 2011-07-04 MED ORDER — HYDROCODONE-ACETAMINOPHEN 5-325 MG PO TABS: ORAL_TABLET | ORAL | Status: DC

## 2011-07-04 NOTE — ED Provider Notes (Signed)
History     CSN: [id number]  Arrival date & time 07/04/11  1622   None     Chief Complaint  Patient presents with  . Ankle Pain    (Consider location/radiation/quality/duration/timing/severity/associated sxs/prior treatment) HPI Comments: Walking down stairs at om's house and missed the last step inverting his R ankle.  No other injuries.  Patient is a 36 y.o. male presenting with ankle pain. The history is provided by the patient. No language interpreter was used.  Ankle Pain  Incident onset: this AM. The injury mechanism was a fall. The pain is present in the right ankle. The quality of the pain is described as throbbing. The pain is at a severity of 8/10. The pain has been constant since onset. Associated symptoms include inability to bear weight. Pertinent negatives include no numbness, no loss of motion, no loss of sensation and no tingling. He reports no foreign bodies present. The symptoms are aggravated by bearing weight. He has tried nothing for the symptoms.    Past Medical History  Diagnosis Date  . Anxiety   . Ankle fracture   . Gastric ulcer     Past Surgical History  Procedure Date  . Orif ankle fracture 11/03/2010    Procedure: OPEN REDUCTION INTERNAL FIXATION (ORIF) ANKLE FRACTURE;  Surgeon: Fuller Canada, MD;  Location: [facility];  Service: Orthopedics;  Laterality: Right;  with Synthes Small Fragment Plates and Screws    No family history on file.  History  Substance Use Topics  . Smoking status: Current Everyday Smoker  . Smokeless tobacco: Not on file  . Alcohol Use: 1.8 oz/week    3 Cans of beer per week     3-24 oz beers      Review of Systems  Musculoskeletal: Positive for joint swelling and gait problem.  Neurological: Negative for tingling, weakness and numbness.  All other systems reviewed and are negative.    Allergies  Review of patient's allergies indicates no known allergies.  Home Medications   Current Outpatient Rx  Name  Route Sig Dispense Refill  . IBUPROFEN 200 MG PO TABS Oral Take 600 mg by mouth 2 (two) times daily as needed. Taking 3 tablets bid for pain    . MIRTAZAPINE 45 MG PO TABS Oral Take 45 mg by mouth at bedtime as needed. Sleep       BP 135/84  Pulse 72  Temp(Src) 98.3 F (36.8 C) (Oral)  Resp 20  Ht 6' (1.829 m)  Wt 180 lb (81.647 kg)  BMI 24.41 kg/m2  SpO2 98%  Physical Exam  Nursing note and vitals reviewed. Constitutional: He is oriented to person, place, and time. He appears well-developed and well-nourished.  HENT:  Head: Normocephalic and atraumatic.  Eyes: EOM are normal.  Neck: Normal range of motion.  Cardiovascular: Normal rate, regular rhythm, normal heart sounds and intact distal pulses.   Pulmonary/Chest: Effort normal and breath sounds normal. No respiratory distress.  Abdominal: Soft. He exhibits no distension. There is no tenderness.  Musculoskeletal: He exhibits tenderness.       Right ankle: He exhibits decreased range of motion and swelling. He exhibits no ecchymosis, no deformity, no laceration and normal pulse. tenderness. Lateral malleolus tenderness found. Achilles tendon normal.       Feet:  Neurological: He is alert and oriented to person, place, and time.  Skin: Skin is warm and dry.  Psychiatric: He has a normal mood and affect. Judgment normal.    ED Course  Procedures (  including critical care time)  Labs Reviewed - No data to display Dg Ankle Complete Right  07/04/2011  *RADIOLOGY REPORT*  Clinical Data: Fall with ankle pain.  History of ankle surgery.  RIGHT ANKLE - COMPLETE 3+ VIEW  Comparison: 12/02/2010  Findings: 2 pins  are present across the medial malleolus, unchanged from the  prior study.  Fibular plate and screws are unchanged from the prior study.  Negative for acute fracture.  Ankle mortise is intact.  IMPRESSION: Negative for acute fracture.  Original Report Authenticated By: Camelia Phenes, M.D.     1. Right ankle sprain        MDM  Has ankle splint and crutches at home.  Ice, elevate as much as possible.  Ibuprofen 800 mg TID.  rx-hydrocodone.  F/u with dr. Romeo Apple.        Worthy Rancher, PA 07/04/11 [phone]

## 2011-07-04 NOTE — ED Notes (Signed)
Pt Dc to home with steady gait. 

## 2011-07-04 NOTE — ED Notes (Signed)
Patient with c/o right ankle pain that started today after fall. Patient reports history of surgery to ankle with pins and screws.

## 2011-07-04 NOTE — Discharge Instructions (Signed)
Ankle Sprain An ankle sprain is an injury to the strong, fibrous tissues (ligaments) that hold the bones of your ankle joint together.  CAUSES Ankle sprain usually is caused by a fall or by twisting your ankle. People who participate in sports are more prone to these types of injuries.  SYMPTOMS  Symptoms of ankle sprain include:  Pain in your ankle. The pain may be present at rest or only when you are trying to stand or walk.   Swelling.   Bruising. Bruising may develop immediately or within 1 to 2 days after your injury.   Difficulty standing or walking.  DIAGNOSIS  Your caregiver will ask you details about your injury and perform a physical exam of your ankle to determine if you have an ankle sprain. During the physical exam, your caregiver will press and squeeze specific areas of your foot and ankle. Your caregiver will try to move your ankle in certain ways. An X-ray exam may be done to be sure a bone was not broken or a ligament did not separate from one of the bones in your ankle (avulsion).  TREATMENT  Certain types of braces can help stabilize your ankle. Your caregiver can make a recommendation for this. Your caregiver may recommend the use of medication for pain. If your sprain is severe, your caregiver may refer you to a surgeon who helps to restore function to parts of your skeletal system (orthopedist) or a physical therapist. HOME CARE INSTRUCTIONS  Apply ice to your injury for 1 to 2 days or as directed by your caregiver. Applying ice helps to reduce inflammation and pain.  Put ice in a plastic bag.   Place a towel between your skin and the bag.   Leave the ice on for 15 to 20 minutes at a time, every 2 hours while you are awake.   Take over-the-counter or prescription medicines for pain, discomfort, or fever only as directed by your caregiver.   Keep your injured leg elevated, when possible, to lessen swelling.   If your caregiver recommends crutches, use them as  instructed. Gradually, put weight on the affected ankle. Continue to use crutches or a cane until you can walk without feeling pain in your ankle.   If you have a plaster splint, wear the splint as directed by your caregiver. Do not rest it on anything harder than a pillow the first 24 hours. Do not put weight on it. Do not get it wet. You may take it off to take a shower or bath.   You may have been given an elastic bandage to wear around your ankle to provide support. If the elastic bandage is too tight (you have numbness or tingling in your foot or your foot becomes cold and blue), adjust the bandage to make it comfortable.   If you have an air splint, you may blow more air into it or let air out to make it more comfortable. You may take your splint off at night and before taking a shower or bath.   Wiggle your toes in the splint several times per day if you are able.  SEEK MEDICAL CARE IF:   You have an increase in bruising, swelling, or pain.   Your toes feel cold.   Pain relief is not achieved with medication.  SEEK IMMEDIATE MEDICAL CARE IF: Your toes are numb or blue or you have severe pain. MAKE SURE YOU:   Understand these instructions.   Will watch your condition.     Will get help right away if you are not doing well or get worse.  Document Released: 02/25/2005 Document Revised: 02/14/2011 Document Reviewed: 09/30/2007 [facility] Patient Information 2012 [place], [place].   Wear your ankle splint and use your crutches as needed.  Bear weight as tolerated.  Apply ice several times daily and elevate as much as possible.    Follow up with dr. Romeo Apple as needed.

## 2011-07-05 NOTE — ED Provider Notes (Signed)
Medical screening examination/treatment/procedure(s) were performed by non-physician practitioner and as supervising physician I was immediately available for consultation/collaboration.   Alexys Lobello III, MD 07/05/11 1514 

## 2011-10-23 ENCOUNTER — Encounter (HOSPITAL_COMMUNITY): Payer: Self-pay | Admitting: *Deleted

## 2011-10-23 ENCOUNTER — Emergency Department (HOSPITAL_COMMUNITY)
Admission: EM | Admit: 2011-10-23 | Discharge: 2011-10-23 | Disposition: A | Discharge status: Home / Self Care | Payer: Self-pay | Attending: Emergency Medicine | Admitting: Emergency Medicine

## 2011-10-23 DIAGNOSIS — IMO0002 Reserved for concepts with insufficient information to code with codable children: Secondary | ICD-10-CM | POA: Insufficient documentation

## 2011-10-23 DIAGNOSIS — L03113 Cellulitis of right upper limb: Secondary | ICD-10-CM

## 2011-10-23 DIAGNOSIS — F17200 Nicotine dependence, unspecified, uncomplicated: Secondary | ICD-10-CM | POA: Insufficient documentation

## 2011-10-23 DIAGNOSIS — F411 Generalized anxiety disorder: Secondary | ICD-10-CM | POA: Insufficient documentation

## 2011-10-23 MED ORDER — CEPHALEXIN 500 MG PO CAPS: 500.0000 mg | ORAL_CAPSULE | Freq: Four times a day (QID) | ORAL | Status: AC

## 2011-10-23 MED ORDER — SULFAMETHOXAZOLE-TRIMETHOPRIM 800-160 MG PO TABS: 1.0000 | ORAL_TABLET | Freq: Two times a day (BID) | ORAL | Status: AC

## 2011-10-23 MED ORDER — OXYCODONE-ACETAMINOPHEN 5-325 MG PO TABS: 1.0000 | ORAL_TABLET | Freq: Four times a day (QID) | ORAL | Status: AC | PRN

## 2011-10-23 NOTE — ED Notes (Signed)
Pt alert & oriented x4, stable gait. Patient given discharge instructions, paperwork & prescription(s). Patient  instructed to stop at the registration desk to finish any additional paperwork. Patient verbalized understanding. Pt left department w/ no further questions. 

## 2011-10-23 NOTE — ED Provider Notes (Signed)
History     CSN: [id number]  Arrival date & time 10/23/11  1933   First MD Initiated Contact with Patient 10/23/11 2034      Chief Complaint  Patient presents with  . Arm Pain    (Consider location/radiation/quality/duration/timing/severity/associated sxs/prior treatment) HPI Comments: Swelling, redness, pain to the right forearm.  No known injury or trauma or bite.   Patient is a 36 y.o. male presenting with arm pain. The history is provided by the patient.  Arm Pain This is a new problem. The current episode started yesterday. The problem occurs constantly. The problem has been gradually worsening. Nothing aggravates the symptoms. Nothing relieves the symptoms. He has tried nothing for the symptoms.    Past Medical History  Diagnosis Date  . Anxiety   . Ankle fracture   . Gastric ulcer     Past Surgical History  Procedure Date  . Orif ankle fracture 11/03/2010    Procedure: OPEN REDUCTION INTERNAL FIXATION (ORIF) ANKLE FRACTURE;  Surgeon: Fuller Canada, MD;  Location: [facility];  Service: Orthopedics;  Laterality: Right;  with Synthes Small Fragment Plates and Screws    History reviewed. No pertinent family history.  History  Substance Use Topics  . Smoking status: Current Everyday Smoker  . Smokeless tobacco: Not on file  . Alcohol Use: 1.8 oz/week    3 Cans of beer per week     3-24 oz beers      Review of Systems  All other systems reviewed and are negative.    Allergies  Hydrocodone and Tramadol  Home Medications   Current Outpatient Rx  Name Route Sig Dispense Refill  . HYDROCODONE-ACETAMINOPHEN 5-325 MG PO TABS  One tab po q 4-6 hrs prn pain 20 tablet 0  . IBUPROFEN 200 MG PO TABS Oral Take 600 mg by mouth 2 (two) times daily as needed. Taking 3 tablets bid for pain    . MIRTAZAPINE 45 MG PO TABS Oral Take 45 mg by mouth at bedtime as needed. Sleep       BP 125/85  Pulse 105  Temp 98 F (36.7 C) (Oral)  Resp 20  Ht 6' (1.829 m)  Wt 180 lb  (81.647 kg)  BMI 24.41 kg/m2  SpO2 100%  Physical Exam  Nursing note and vitals reviewed. Constitutional: He is oriented to person, place, and time. He appears well-developed and well-nourished. No distress.  HENT:  Head: Normocephalic and atraumatic.  Neck: Normal range of motion. Neck supple.  Musculoskeletal:       There is an area of redness, erythema to the right forearm.  There is no fluctuance or pus draining.  There is no pain with extension of the fingers or wrist.  Distal pulses are easily palpable.  Neurological: He is alert and oriented to person, place, and time.  Skin: Skin is warm and dry. He is not diaphoretic.    ED Course  Procedures (including critical care time)  Labs Reviewed - No data to display No results found.   No diagnosis found.    MDM  No fluctuance or evidence for abscess.  Will treat with bactrim, keflex, percocet.        Geoffery Lyons, MD 10/23/11 [phone]

## 2011-10-23 NOTE — ED Notes (Signed)
Pain , swelling rt  Forearm. Redness,

## 2011-10-25 ENCOUNTER — Emergency Department (HOSPITAL_COMMUNITY)
Admission: EM | Admit: 2011-10-25 | Discharge: 2011-10-25 | Disposition: A | Discharge status: Home / Self Care | Payer: Self-pay | Attending: Emergency Medicine | Admitting: Emergency Medicine

## 2011-10-25 ENCOUNTER — Encounter (HOSPITAL_COMMUNITY): Payer: Self-pay | Admitting: Emergency Medicine

## 2011-10-25 DIAGNOSIS — F17200 Nicotine dependence, unspecified, uncomplicated: Secondary | ICD-10-CM | POA: Insufficient documentation

## 2011-10-25 DIAGNOSIS — IMO0002 Reserved for concepts with insufficient information to code with codable children: Secondary | ICD-10-CM | POA: Insufficient documentation

## 2011-10-25 DIAGNOSIS — L02413 Cutaneous abscess of right upper limb: Secondary | ICD-10-CM

## 2011-10-25 LAB — URINALYSIS, ROUTINE W REFLEX MICROSCOPIC
Glucose, UA: NEGATIVE mg/dL
Hgb urine dipstick: NEGATIVE
Ketones, ur: NEGATIVE mg/dL
Protein, ur: NEGATIVE mg/dL

## 2011-10-25 LAB — CBC WITH DIFFERENTIAL/PLATELET
Basophils Absolute: 0 10*3/uL (ref 0.0–0.1)
Eosinophils Absolute: 0.1 10*3/uL (ref 0.0–0.7)
Eosinophils Relative: 2 % (ref 0–5)
HCT: 38.5 % — ABNORMAL LOW (ref 39.0–52.0)
Lymphocytes Relative: 29 % (ref 12–46)
MCH: 32.4 pg (ref 26.0–34.0)
MCHC: 35.1 g/dL (ref 30.0–36.0)
MCV: 92.3 fL (ref 78.0–100.0)
Monocytes Absolute: 0.5 10*3/uL (ref 0.1–1.0)
RDW: 12.2 % (ref 11.5–15.5)
WBC: 7.4 10*3/uL (ref 4.0–10.5)

## 2011-10-25 LAB — BASIC METABOLIC PANEL
CO2: 28 mEq/L (ref 19–32)
Calcium: 9.1 mg/dL (ref 8.4–10.5)
Creatinine, Ser: 0.89 mg/dL (ref 0.50–1.35)
GFR calc non Af Amer: >90 mL/min (ref >90)

## 2011-10-25 MED ORDER — OXYCODONE-ACETAMINOPHEN 5-325 MG PO TABS: 1.0000 | ORAL_TABLET | ORAL | Status: AC | PRN

## 2011-10-25 MED ORDER — LIDOCAINE HCL (PF) 2 % IJ SOLN
INTRAMUSCULAR | Status: AC
  Filled 2011-10-25: qty 10

## 2011-10-25 MED ORDER — SODIUM CHLORIDE 0.9 % IV SOLN
Freq: Once | INTRAVENOUS | Status: AC
  Administered 2011-10-25: 20:00:00 via INTRAVENOUS

## 2011-10-25 MED ORDER — MORPHINE SULFATE 4 MG/ML IJ SOLN
4.0000 mg | Freq: Once | INTRAMUSCULAR | Status: AC
  Administered 2011-10-25: 4 mg via INTRAVENOUS
  Filled 2011-10-25: qty 1

## 2011-10-25 MED ORDER — ONDANSETRON HCL 4 MG/2ML IJ SOLN
4.0000 mg | Freq: Once | INTRAMUSCULAR | Status: AC
  Administered 2011-10-25: 4 mg via INTRAVENOUS
  Filled 2011-10-25: qty 2

## 2011-10-25 NOTE — ED Notes (Signed)
Pt c/o increased rt arm pain.Pt has been treated for infection here, but states the swelling has gotten worse.

## 2011-10-25 NOTE — ED Provider Notes (Signed)
History  This chart was scribed for Carleene Cooper III, MD by Erskine Emery. This patient was seen in room [facility] and the patient's care was started at 19:28.   CSN: [id number]  Arrival date & time 10/25/11  1826   First MD Initiated Contact with Patient 10/25/11 1928      Chief Complaint  Patient presents with  . Abscess  . Arm Pain    (Consider location/radiation/quality/duration/timing/severity/associated sxs/prior treatment) HPI Pedro Gray is a 36 y.o. male who presents to the Emergency Department complaining of gradually worsening right arm pain and swelling around the area of an insect bite for the last 3 days. Pt was seen here 2 days ago and was prescribed bactrim and keflex, but his symptoms persisted. Pt is a current everyday smoker.    Past Medical History  Diagnosis Date  . Anxiety   . Ankle fracture   . Gastric ulcer     Past Surgical History  Procedure Date  . Orif ankle fracture 11/03/2010    Procedure: OPEN REDUCTION INTERNAL FIXATION (ORIF) ANKLE FRACTURE;  Surgeon: Fuller Canada, MD;  Location: [facility];  Service: Orthopedics;  Laterality: Right;  with Synthes Small Fragment Plates and Screws    History reviewed. No pertinent family history.  History  Substance Use Topics  . Smoking status: Current Everyday Smoker  . Smokeless tobacco: Not on file  . Alcohol Use: 1.8 oz/week    3 Cans of beer per week     3-24 oz beers      Review of Systems  Constitutional: Negative for fatigue.  HENT: Negative for congestion, sinus pressure and ear discharge.   Eyes: Negative for discharge.  Respiratory: Negative for cough.   Cardiovascular: Negative for chest pain.  Gastrointestinal: Negative for abdominal pain and diarrhea.  Genitourinary: Negative for frequency and hematuria.  Musculoskeletal: Negative for back pain.       Right arm pain  Skin: Positive for wound.  Neurological: Negative for seizures and headaches.  Hematological: Negative.     Psychiatric/Behavioral: Negative for hallucinations.    Allergies  Hydrocodone and Tramadol  Home Medications   Current Outpatient Rx  Name Route Sig Dispense Refill  . CEPHALEXIN 500 MG PO CAPS Oral Take 1 capsule (500 mg total) by mouth 4 (four) times daily. 40 capsule 0  . IBUPROFEN 200 MG PO TABS Oral Take 600 mg by mouth 2 (two) times daily as needed. Taking 3 tablets bid for pain    . MIRTAZAPINE 45 MG PO TABS Oral Take 45 mg by mouth at bedtime as needed. Sleep     . OXYCODONE-ACETAMINOPHEN 5-325 MG PO TABS Oral Take 1-2 tablets by mouth every 6 (six) hours as needed for pain. 15 tablet 0  . SULFAMETHOXAZOLE-TRIMETHOPRIM 800-160 MG PO TABS Oral Take 1 tablet by mouth every 12 (twelve) hours. 20 tablet 0    Triage Vitals: BP 144/113  Pulse 95  Temp 98.5 F (36.9 C) (Oral)  Resp 20  Ht 6' (1.829 m)  Wt 180 lb (81.647 kg)  BMI 24.41 kg/m2  SpO2 100%  Physical Exam  Nursing note and vitals reviewed. Constitutional: He is oriented to person, place, and time. He appears well-developed and well-nourished. No distress.  HENT:  Head: Normocephalic and atraumatic.  Eyes: EOM are normal.  Neck: Neck supple. No tracheal deviation present.  Cardiovascular: Normal rate.   Pulmonary/Chest: Effort normal. No respiratory distress.  Abdominal: Soft. He exhibits no distension.  Musculoskeletal: Normal range of motion. He exhibits no  edema.  Neurological: He is alert and oriented to person, place, and time.  Skin: Skin is warm and dry.       Area of erythema around an insect bite, 3 cm in diameter with cellulitis that extend all over forearm to elbow. No lymphangitis.  Psychiatric: He has a normal mood and affect.    ED Course  INCISION AND DRAINAGE Date/Time: 10/25/2011 9:06 PM Performed by: Pedro Gray Authorized by: Pedro Gray Consent: Verbal consent obtained. Written consent obtained. Risks and benefits: risks, benefits and alternatives were  discussed Consent given by: patient Patient understanding: patient states understanding of the procedure being performed Patient consent: the patient's understanding of the procedure matches consent given Procedure consent: procedure consent matches procedure scheduled Required items: required blood products, implants, devices, and special equipment available Patient identity confirmed: verbally with patient and arm band Type: abscess Body area: upper extremity Location details: right arm Anesthesia: local infiltration Local anesthetic: lidocaine 2% without epinephrine Patient sedated: no Scalpel size: 11 Needle gauge: 22 Incision type: single straight Complexity: simple Drainage: purulent Drainage amount: moderate Wound treatment: drain placed Packing material: 1/4 in gauze Patient tolerance: Patient tolerated the procedure well with no immediate complications.   (including critical care time)  DIAGNOSTIC STUDIES: Oxygen Saturation is 100% on room air, normal by my interpretation.    COORDINATION OF CARE: 19:40--I evaluated the patient and we discussed a treatment plan including incision and drainage and blood work to which the pt agreed.   21:03--I performed incision and drainage procedure.   22:22--I rechecked the pt. I informed him of his negative lab findings. I told the pt that he can be discharged with pain medication but will need to follow up soon.  Labs Reviewed  CBC WITH DIFFERENTIAL - Abnormal; Notable for the following:    RBC 4.17 (*)     HCT 38.5 (*)     All other components within normal limits  BASIC METABOLIC PANEL - Abnormal; Notable for the following:    Glucose, Bld 120 (*)     All other components within normal limits  URINALYSIS, ROUTINE W REFLEX MICROSCOPIC  WOUND CULTURE   Results for orders placed during the hospital encounter of 10/25/11  CBC WITH DIFFERENTIAL      Component Value Range   WBC 7.4  4.0 - 10.5 K/uL   RBC 4.17 (*) 4.22 - 5.81  MIL/uL   Hemoglobin 13.5  13.0 - 17.0 g/dL   HCT 16.1 (*) 09.6 - 04.5 %   MCV 92.3  78.0 - 100.0 fL   MCH 32.4  26.0 - 34.0 pg   MCHC 35.1  30.0 - 36.0 g/dL   RDW 40.9  81.1 - 91.4 %   Platelets 186  150 - 400 K/uL   Neutrophils Relative 63  43 - 77 %   Neutro Abs 4.7  1.7 - 7.7 K/uL   Lymphocytes Relative 29  12 - 46 %   Lymphs Abs 2.2  0.7 - 4.0 K/uL   Monocytes Relative 7  3 - 12 %   Monocytes Absolute 0.5  0.1 - 1.0 K/uL   Eosinophils Relative 2  0 - 5 %   Eosinophils Absolute 0.1  0.0 - 0.7 K/uL   Basophils Relative 0  0 - 1 %   Basophils Absolute 0.0  0.0 - 0.1 K/uL  BASIC METABOLIC PANEL      Component Value Range   Sodium 135  135 - 145 mEq/L   Potassium 4.0  3.5 - 5.1 mEq/L   Chloride 101  96 - 112 mEq/L   CO2 28  19 - 32 mEq/L   Glucose, Bld 120 (*) 70 - 99 mg/dL   BUN 10  6 - 23 mg/dL   Creatinine, Ser 1.61  0.50 - 1.35 mg/dL   Calcium 9.1  8.4 - 09.6 mg/dL   GFR calc non Af Amer >90  >90 mL/min   GFR calc Af Amer >90  >90 mL/min  URINALYSIS, ROUTINE W REFLEX MICROSCOPIC      Component Value Range   Color, Urine AMBER (*) YELLOW   APPearance CLEAR  CLEAR   Specific Gravity, Urine 1.015  1.005 - 1.030   pH 6.5  5.0 - 8.0   Glucose, UA NEGATIVE  NEGATIVE mg/dL   Hgb urine dipstick NEGATIVE  NEGATIVE   Bilirubin Urine NEGATIVE  NEGATIVE   Ketones, ur NEGATIVE  NEGATIVE mg/dL   Protein, ur NEGATIVE  NEGATIVE mg/dL   Urobilinogen, UA 1.0  0.0 - 1.0 mg/dL   Nitrite NEGATIVE  NEGATIVE   Leukocytes, UA NEGATIVE  NEGATIVE   Lab tests reassuringly normal.  Advised to wear a sling to elevate his right arm.  Percocet q4h prn pain.  Continue his antibiotics.  Return in 2 days for wound check, drain removal.      1. Abscess of right forearm      I personally performed the services described in this documentation, which was scribed in my presence. The recorded information has been reviewed and considered.  Pedro Human, MD     Carleene Cooper III,  MD 10/25/11 2227

## 2011-10-29 ENCOUNTER — Encounter (HOSPITAL_COMMUNITY): Payer: Self-pay | Admitting: *Deleted

## 2011-10-29 ENCOUNTER — Emergency Department (HOSPITAL_COMMUNITY)
Admission: EM | Admit: 2011-10-29 | Discharge: 2011-10-29 | Disposition: A | Discharge status: Home / Self Care | Payer: Self-pay | Attending: Emergency Medicine | Admitting: Emergency Medicine

## 2011-10-29 DIAGNOSIS — F17200 Nicotine dependence, unspecified, uncomplicated: Secondary | ICD-10-CM | POA: Insufficient documentation

## 2011-10-29 DIAGNOSIS — L0291 Cutaneous abscess, unspecified: Secondary | ICD-10-CM

## 2011-10-29 DIAGNOSIS — IMO0002 Reserved for concepts with insufficient information to code with codable children: Secondary | ICD-10-CM | POA: Insufficient documentation

## 2011-10-29 LAB — WOUND CULTURE

## 2011-10-29 MED ORDER — OXYCODONE-ACETAMINOPHEN 5-325 MG PO TABS: 1.0000 | ORAL_TABLET | ORAL | Status: AC | PRN

## 2011-10-29 NOTE — ED Provider Notes (Signed)
History     CSN: [id number]  Arrival date & time 10/29/11  1049   First MD Initiated Contact with Patient 10/29/11 1100      Chief Complaint  Patient presents with  . Wound Check    (Consider location/radiation/quality/duration/timing/severity/associated sxs/prior treatment) HPI Comments: Pedro Gray presents for recheck of his right arm abscess with cellulitis.  He was seen here 4 days ago and had the area drained.  He reports a small amount of persistent drainage when he changes the bandage twice daily,  But the pain,  Swelling and redness have greatly improved.  He is taking bactrim and keflex.  He took his last oxycodone this morning.  He has had no nausea, vomiting or fevers.    The history is provided by the patient.    Past Medical History  Diagnosis Date  . Anxiety   . Ankle fracture   . Gastric ulcer     Past Surgical History  Procedure Date  . Orif ankle fracture 11/03/2010    Procedure: OPEN REDUCTION INTERNAL FIXATION (ORIF) ANKLE FRACTURE;  Surgeon: Fuller Canada, MD;  Location: [facility];  Service: Orthopedics;  Laterality: Right;  with Synthes Small Fragment Plates and Screws    History reviewed. No pertinent family history.  History  Substance Use Topics  . Smoking status: Current Everyday Smoker  . Smokeless tobacco: Not on file  . Alcohol Use: 1.8 oz/week    3 Cans of beer per week     3-24 oz beers      Review of Systems  Constitutional: Negative for fever and chills.  HENT: Negative for facial swelling.   Respiratory: Negative for shortness of breath and wheezing.   Skin: Positive for wound.  Neurological: Negative for numbness.    Allergies  Hydrocodone and Tramadol  Home Medications   Current Outpatient Rx  Name Route Sig Dispense Refill  . CEPHALEXIN 500 MG PO CAPS Oral Take 1 capsule (500 mg total) by mouth 4 (four) times daily. 40 capsule 0  . IBUPROFEN 200 MG PO TABS Oral Take 600 mg by mouth 2 (two) times daily as needed.  Taking 3 tablets bid for pain    . MIRTAZAPINE 45 MG PO TABS Oral Take 45 mg by mouth at bedtime as needed. Sleep     . OXYCODONE-ACETAMINOPHEN 5-325 MG PO TABS Oral Take 1-2 tablets by mouth every 6 (six) hours as needed for pain. 15 tablet 0  . OXYCODONE-ACETAMINOPHEN 5-325 MG PO TABS Oral Take 1 tablet by mouth every 4 (four) hours as needed for pain. 20 tablet 0  . OXYCODONE-ACETAMINOPHEN 5-325 MG PO TABS Oral Take 1 tablet by mouth every 4 (four) hours as needed for pain. 15 tablet 0  . SULFAMETHOXAZOLE-TRIMETHOPRIM 800-160 MG PO TABS Oral Take 1 tablet by mouth every 12 (twelve) hours. 20 tablet 0    BP 110/58  Pulse 81  Temp 97.6 F (36.4 C) (Oral)  Resp 20  Ht 6' (1.829 m)  Wt 180 lb (81.647 kg)  BMI 24.41 kg/m2  SpO2 100%  Physical Exam  Constitutional: He is oriented to person, place, and time. He appears well-developed and well-nourished.  HENT:  Head: Normocephalic.  Cardiovascular: Normal rate.   Pulmonary/Chest: Effort normal.  Neurological: He is alert and oriented to person, place, and time. No sensory deficit.  Skin:       Well healing abscess right mid dorsal forearm, packing in place,  No surrounding erythema or edema.  No red streaking.  ED Course  Wound packing Date/Time: 10/29/2011 11:35 AM Performed by: Burgess Amor Authorized by: Burgess Amor Consent: Verbal consent obtained. Risks and benefits: risks, benefits and alternatives were discussed Consent given by: patient Time out: Immediately prior to procedure a "time out" was called to verify the correct patient, procedure, equipment, support staff and site/side marked as required. Preparation: Patient was prepped and draped in the usual sterile fashion. Local anesthesia used: no Comments: Old packing was gently removed,  Then the wound was flushed with NS,  No further drainage of pus. Abscess wall and base pink healthy appearing granulation tissue.  Gently repacked looser,  Pt tolerated well.    (including critical care time)  Labs Reviewed - No data to display No results found.   1. Abscess       MDM  Pt advised to remove packing in 3 days,  Feels comfortable doing this.  At that time,  Start BID water massage ,  Dry and cover.  Finish abx.  Return here for any worsened sx.        Burgess Amor, [place] 10/29/11 1137

## 2011-10-29 NOTE — ED Notes (Signed)
Here for recheck of I and D site rt forearm with packing in place.

## 2011-10-29 NOTE — ED Notes (Signed)
Pt here for removal of packing in abscess on r forearm.

## 2011-10-29 NOTE — ED Provider Notes (Signed)
Medical screening examination/treatment/procedure(s) were performed by non-physician practitioner and as supervising physician I was immediately available for consultation/collaboration.   Glynn Octave, MD 10/29/11 [phone]

## 2011-10-30 NOTE — ED Notes (Signed)
Patient treated with Keflex and Septra-sensitive to same-Chart appended per protocol MD.

## 2012-07-17 ENCOUNTER — Encounter (HOSPITAL_COMMUNITY): Payer: Self-pay | Admitting: *Deleted

## 2012-07-17 ENCOUNTER — Emergency Department (HOSPITAL_COMMUNITY)
Admission: EM | Admit: 2012-07-17 | Discharge: 2012-07-17 | Disposition: A | Discharge status: Home / Self Care | Payer: Self-pay | Attending: Emergency Medicine | Admitting: Emergency Medicine

## 2012-07-17 DIAGNOSIS — Z8711 Personal history of peptic ulcer disease: Secondary | ICD-10-CM | POA: Insufficient documentation

## 2012-07-17 DIAGNOSIS — Z8659 Personal history of other mental and behavioral disorders: Secondary | ICD-10-CM | POA: Insufficient documentation

## 2012-07-17 DIAGNOSIS — S0183XA Puncture wound without foreign body of other part of head, initial encounter: Secondary | ICD-10-CM

## 2012-07-17 DIAGNOSIS — Y990 Civilian activity done for income or pay: Secondary | ICD-10-CM | POA: Insufficient documentation

## 2012-07-17 DIAGNOSIS — W298XXA Contact with other powered powered hand tools and household machinery, initial encounter: Secondary | ICD-10-CM | POA: Insufficient documentation

## 2012-07-17 DIAGNOSIS — Z8781 Personal history of (healed) traumatic fracture: Secondary | ICD-10-CM | POA: Insufficient documentation

## 2012-07-17 DIAGNOSIS — Y9289 Other specified places as the place of occurrence of the external cause: Secondary | ICD-10-CM | POA: Insufficient documentation

## 2012-07-17 DIAGNOSIS — Z79899 Other long term (current) drug therapy: Secondary | ICD-10-CM | POA: Insufficient documentation

## 2012-07-17 DIAGNOSIS — F17200 Nicotine dependence, unspecified, uncomplicated: Secondary | ICD-10-CM | POA: Insufficient documentation

## 2012-07-17 DIAGNOSIS — S0180XA Unspecified open wound of other part of head, initial encounter: Secondary | ICD-10-CM | POA: Insufficient documentation

## 2012-07-17 DIAGNOSIS — Y9389 Activity, other specified: Secondary | ICD-10-CM | POA: Insufficient documentation

## 2012-07-17 MED ORDER — SULFAMETHOXAZOLE-TRIMETHOPRIM 800-160 MG PO TABS: 1.0000 | ORAL_TABLET | Freq: Two times a day (BID) | ORAL | Status: DC

## 2012-07-17 MED ORDER — DICLOFENAC SODIUM 75 MG PO TBEC: 75.0000 mg | DELAYED_RELEASE_TABLET | Freq: Two times a day (BID) | ORAL | Status: DC

## 2012-07-17 MED ORDER — ACETAMINOPHEN-CODEINE #3 300-30 MG PO TABS: 1.0000 | ORAL_TABLET | Freq: Four times a day (QID) | ORAL | Status: DC | PRN

## 2012-07-17 MED ORDER — KETOROLAC TROMETHAMINE 10 MG PO TABS
10.0000 mg | ORAL_TABLET | Freq: Once | ORAL | Status: AC
  Administered 2012-07-17: 10 mg via ORAL
  Filled 2012-07-17: qty 1

## 2012-07-17 MED ORDER — SULFAMETHOXAZOLE-TMP DS 800-160 MG PO TABS
1.0000 | ORAL_TABLET | Freq: Once | ORAL | Status: AC
  Administered 2012-07-17: 1 via ORAL
  Filled 2012-07-17: qty 1

## 2012-07-17 MED ORDER — BACITRACIN-NEOMYCIN-POLYMYXIN 400-5-5000 EX OINT
TOPICAL_OINTMENT | Freq: Once | CUTANEOUS | Status: AC
  Administered 2012-07-17: 1 via TOPICAL
  Filled 2012-07-17: qty 1

## 2012-07-17 NOTE — ED Provider Notes (Signed)
History     CSN: [id number]  Arrival date & time 07/17/12  1958   First MD Initiated Contact with Patient 07/17/12 2053      Chief Complaint  Patient presents with  . Facial Laceration    (Consider location/radiation/quality/duration/timing/severity/associated sxs/prior treatment) HPI Comments: Patient states he was working with a saw today when one of the points of the blade stab him decide the left nostril. The patient states that he was able to quickly get the bleeding under control. He denies any injury to the teeth. He's not had any problem with vision changes or eye pain. He does have some soreness at the puncture site. The patient has not had any recent surgery or procedures involving the left face. The patient denies being on any platelet altering medications. Patient states his last tetanus shot was less than 5 years ago.  The history is provided by the patient.    Past Medical History  Diagnosis Date  . Anxiety   . Ankle fracture   . Gastric ulcer     Past Surgical History  Procedure Laterality Date  . Orif ankle fracture  11/03/2010    Procedure: OPEN REDUCTION INTERNAL FIXATION (ORIF) ANKLE FRACTURE;  Surgeon: Fuller Canada, MD;  Location: [facility];  Service: Orthopedics;  Laterality: Right;  with Synthes Small Fragment Plates and Screws    History reviewed. No pertinent family history.  History  Substance Use Topics  . Smoking status: Current Every Day Smoker  . Smokeless tobacco: Not on file  . Alcohol Use: 1.8 oz/week    3 Cans of beer per week     Comment: 3-24 oz beers      Review of Systems  Constitutional: Negative for activity change.       All ROS Neg except as noted in HPI  HENT: Negative for nosebleeds and neck pain.   Eyes: Negative for photophobia and discharge.  Respiratory: Negative for cough, shortness of breath and wheezing.   Cardiovascular: Negative for chest pain and palpitations.  Gastrointestinal: Positive for abdominal pain.  Negative for blood in stool.  Genitourinary: Negative for dysuria, frequency and hematuria.  Musculoskeletal: Positive for arthralgias. Negative for back pain.  Skin: Negative.   Neurological: Negative for dizziness, seizures and speech difficulty.  Psychiatric/Behavioral: Negative for hallucinations and confusion. The patient is nervous/anxious.     Allergies  Hydrocodone and Tramadol  Home Medications   Current Outpatient Rx  Name  Route  Sig  Dispense  Refill  . Aspirin-Salicylamide-Caffeine (BC HEADACHE) 325-95-16 MG TABS   Oral   Take 1 packet by mouth daily as needed (for pain).         Marland Kitchen ibuprofen (ADVIL,MOTRIN) 200 MG tablet   Oral   Take 200 mg by mouth every 6 (six) hours as needed for pain.         Marland Kitchen diclofenac (VOLTAREN) 75 MG EC tablet   Oral   Take 1 tablet (75 mg total) by mouth 2 (two) times daily.   14 tablet   0     Please take after a meal.   . diclofenac (VOLTAREN) 75 MG EC tablet   Oral   Take 1 tablet (75 mg total) by mouth 2 (two) times daily.   14 tablet   0   . sulfamethoxazole-trimethoprim (SEPTRA DS) 800-160 MG per tablet   Oral   Take 1 tablet by mouth every 12 (twelve) hours.   14 tablet   0   . sulfamethoxazole-trimethoprim (SEPTRA DS) 800-160  MG per tablet   Oral   Take 1 tablet by mouth every 12 (twelve) hours.   14 tablet   0     BP 131/78  Pulse 90  Temp(Src) 97.7 F (36.5 C) (Oral)  Resp 20  Ht 5\' 10"  (1.778 m)  Wt 170 lb (77.111 kg)  BMI 24.39 kg/m2  SpO2 99%  Physical Exam  Nursing note and vitals reviewed. Constitutional: He is oriented to person, place, and time. He appears well-developed and well-nourished.  Non-toxic appearance.  HENT:  Head: Normocephalic.  Right Ear: Tympanic membrane and external ear normal.  Left Ear: Tympanic membrane and external ear normal.  Nose:    Patient has a small scabbed area adjacent to the left nares. No active bleeding appreciated. The area measures 2 mm. This is not a  through and through puncture. There is no hematoma palpated in the area.  Eyes: EOM and lids are normal. Pupils are equal, round, and reactive to light.  Neck: Normal range of motion. Neck supple. Carotid bruit is not present.  Cardiovascular: Normal rate, regular rhythm, normal heart sounds, intact distal pulses and normal pulses.   Pulmonary/Chest: Breath sounds normal. No respiratory distress.  Abdominal: Soft. Bowel sounds are normal. There is no tenderness. There is no guarding.  Musculoskeletal: Normal range of motion.  Lymphadenopathy:       Head (right side): No submandibular adenopathy present.       Head (left side): No submandibular adenopathy present.    He has no cervical adenopathy.  Neurological: He is alert and oriented to person, place, and time. He has normal strength. No cranial nerve deficit or sensory deficit.  Skin: Skin is warm and dry.  Psychiatric: He has a normal mood and affect. His speech is normal.    ED Course  Procedures (including critical care time)  Labs Reviewed - No data to display No results found.   1. Puncture wound of face, initial encounter       MDM  I have reviewed nursing notes, vital signs, and all appropriate lab and imaging results for this patient. Patient sustained a puncture to the left face. Bleeding is being controlled. There no neurovascular changes noted about the face. No injury to the teeth appreciated.  The plan at this time is for the patient be treated with Septra 2 times daily, and Tylenol with Codeine #3 for soreness. The patient has been asked to cleanse the wound area twice daily with soap and water and to use Neosporin on the area until it has healed completely. The patient is to return to the emergency department if any changes, problems, or signs of infection.       Kathie Dike, PA-C 07/17/12 2209

## 2012-07-17 NOTE — ED Notes (Signed)
Lac lt side of nose,

## 2012-07-17 NOTE — ED Notes (Signed)
Applied neosporin to area on nose, left open to air and advised pt to wash with soap and water daily and keep clean and dry

## 2012-07-20 NOTE — ED Provider Notes (Signed)
Medical screening examination/treatment/procedure(s) were performed by non-physician practitioner and as supervising physician I was immediately available for consultation/collaboration.   Raekwan Spelman W. Acey Woodfield, MD 07/20/12 0509 

## 2013-03-25 IMAGING — CT CT HEAD W/O CM
5 of 9 series · 16 of 47 positions shown, 18 images · non-contrast
Comparison: 07/30/2004

CT HEAD

CLINICAL DATA: Fall, hit face.

CT HEAD WITHOUT CONTRAST
CT MAXILLOFACIAL WITHOUT CONTRAST
CT CERVICAL SPINE WITHOUT CONTRAST
TECHNIQUE: Multidetector CT imaging of the head, cervical spine,
and maxillofacial structures were performed using the standard
protocol without intravenous contrast. Multiplanar CT image
reconstructions of the cervical spine and maxillofacial structures
were also generated.

[Series 3: headseq 2.4 h60s · axial · 0.43mm/px · z∈[+35,+139]mm · 4 of 72 slices shown]
[im 15/72  brain]
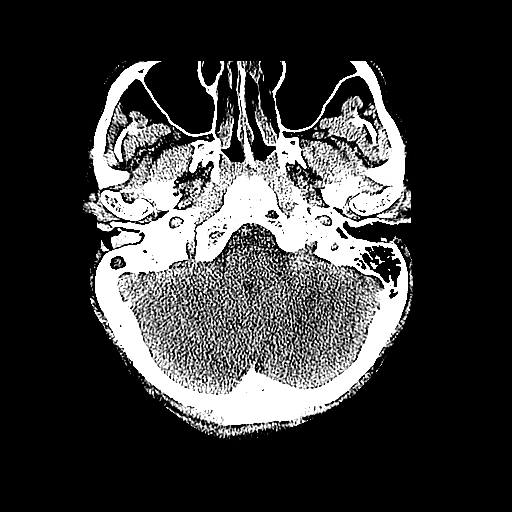
[im 29/72  brain]
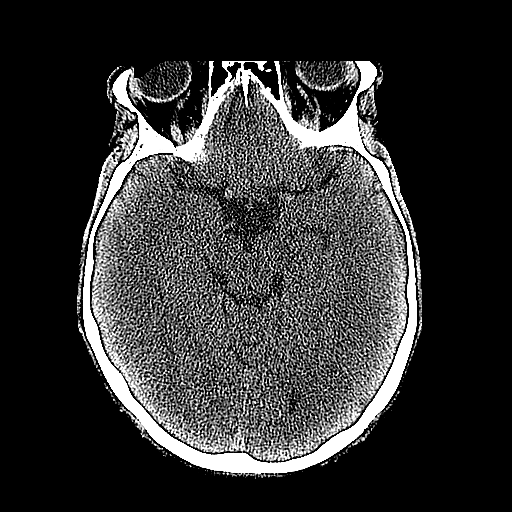
[im 43/72  brain]
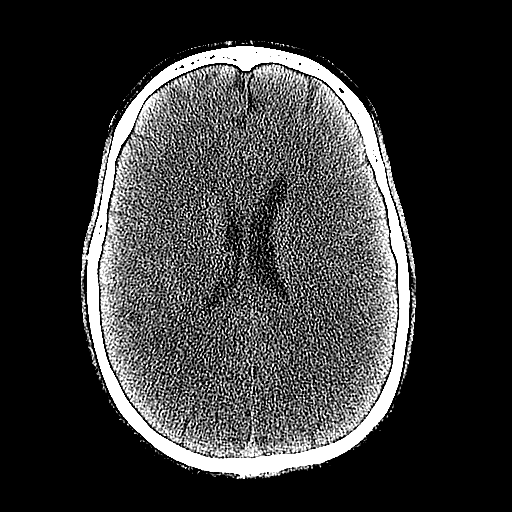
[im 57/72  brain]
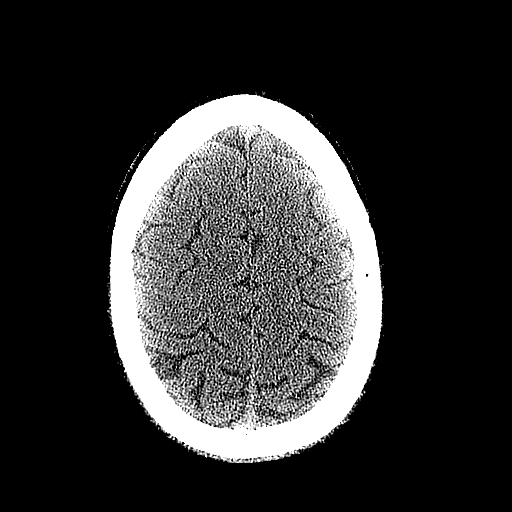

[Series 4: max st 2.0 h31s · axial · 0.37mm/px · z∈[-36,+88]mm · 5 of 94 slices shown, 7 images]
[im 16/94  brain]
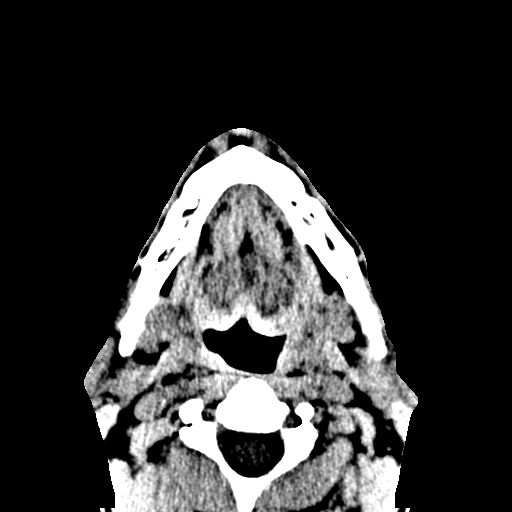
[im 16/94  bone]
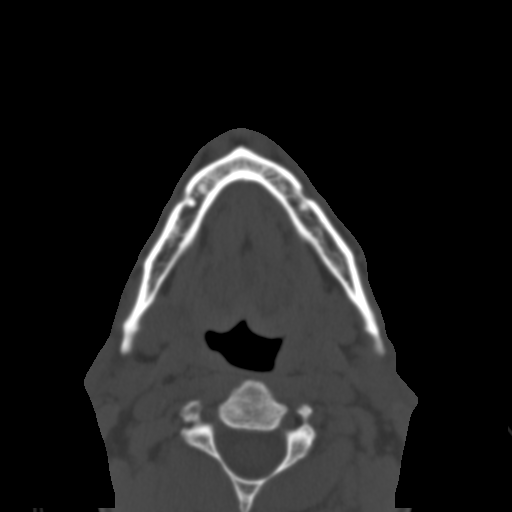
[im 32/94  brain]
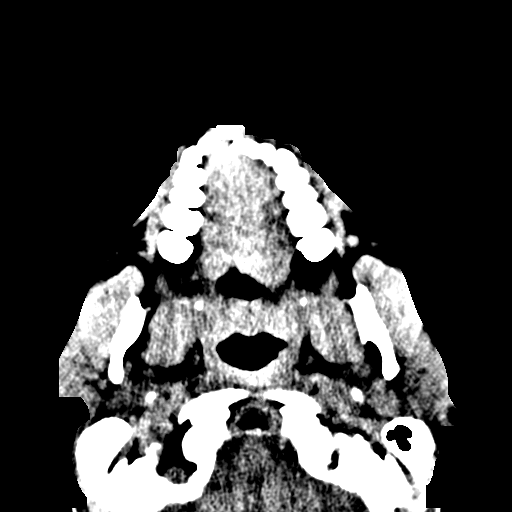
[im 47/94  brain]
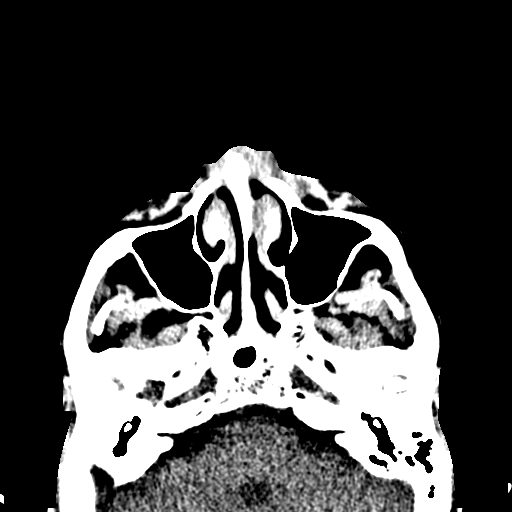
[im 63/94  brain]
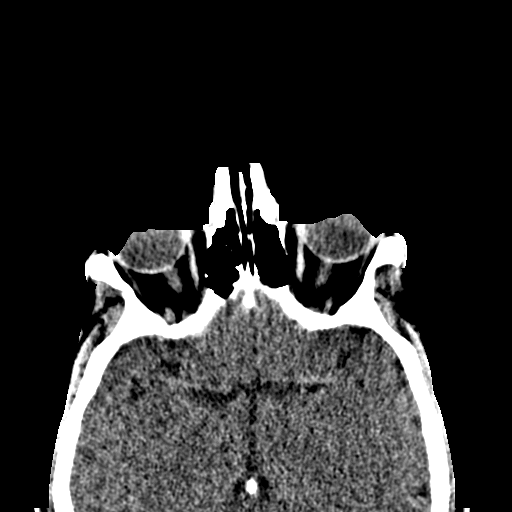
[im 78/94  brain]
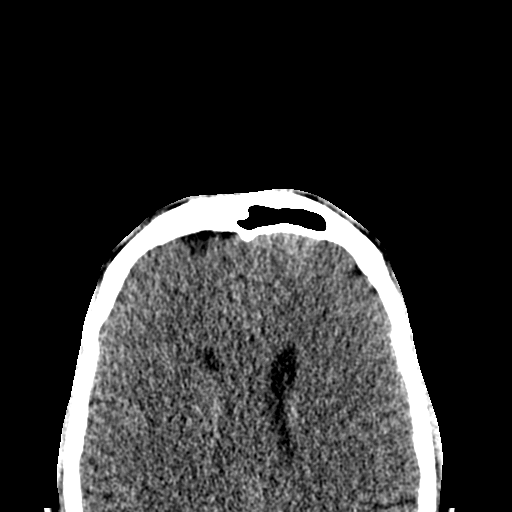
[im 78/94  bone]
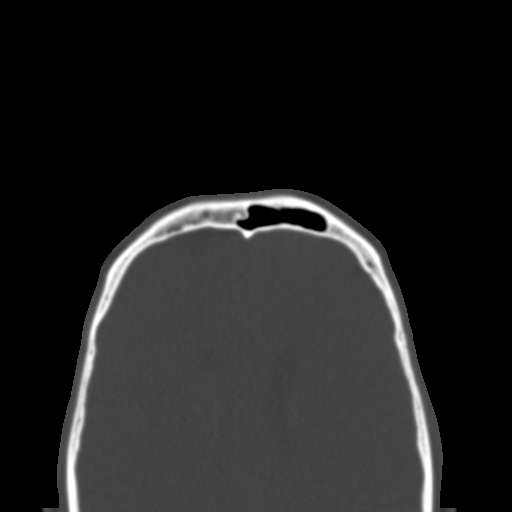

[Series 6: max st coronal · coronal · 0.38mm/px · 3 of 101 slices shown]
[im 34/101  brain]
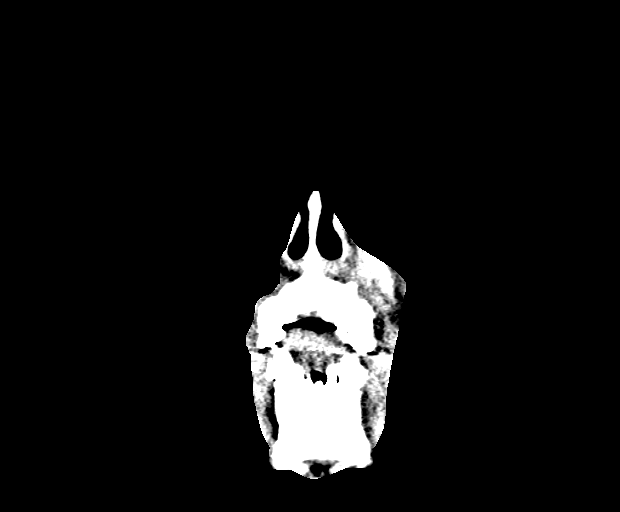
[im 51/101  brain]
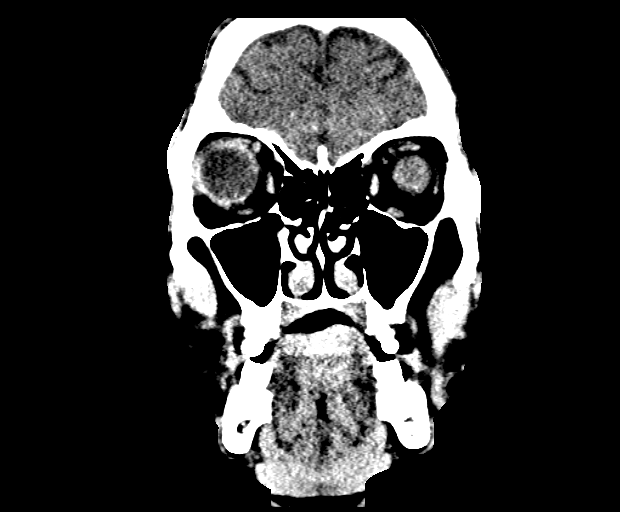
[im 67/101  brain]
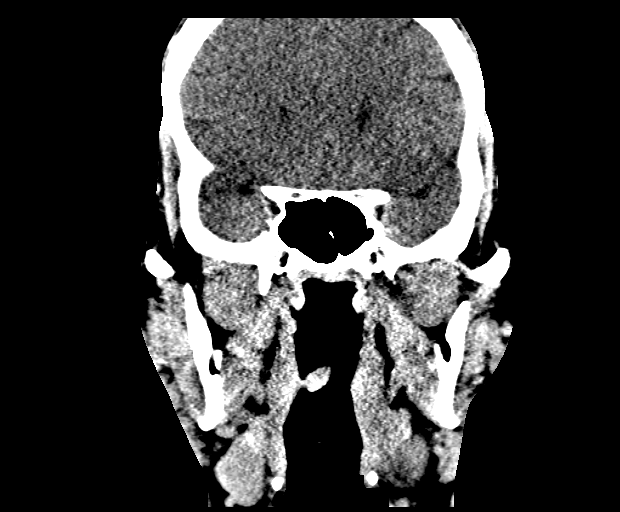

[Series 7: max st sag · sagittal · 0.38mm/px · 2 of 105 slices shown]
[im 35/105  brain]
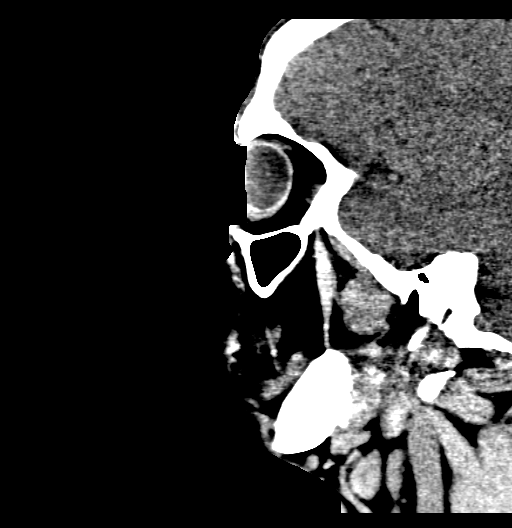
[im 70/105  brain]
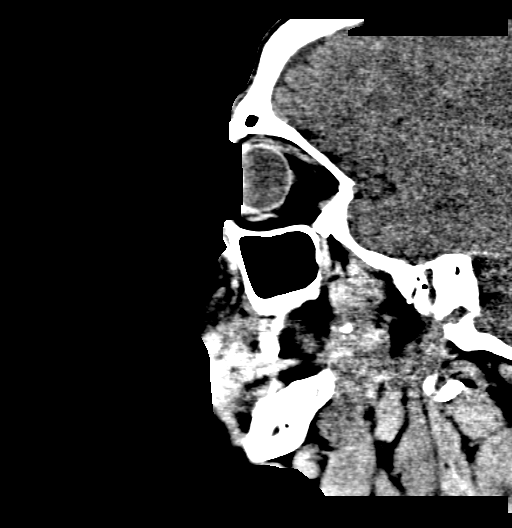

[Series 11: cervical 2.0 st axial · axial · 0.25mm/px · z∈[-132,-100]mm · 2 of 94 slices shown]
[im 16/94  brain]
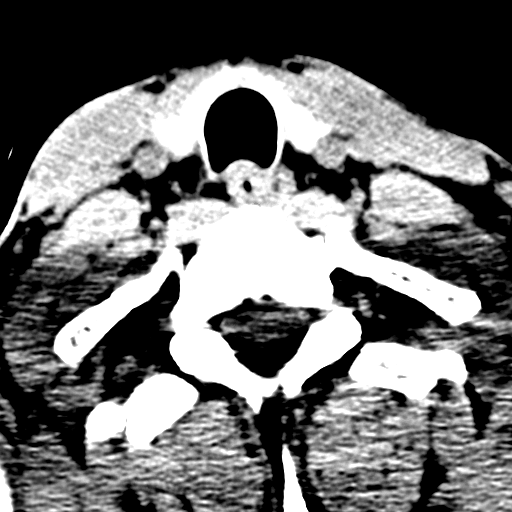
[im 32/94  brain]
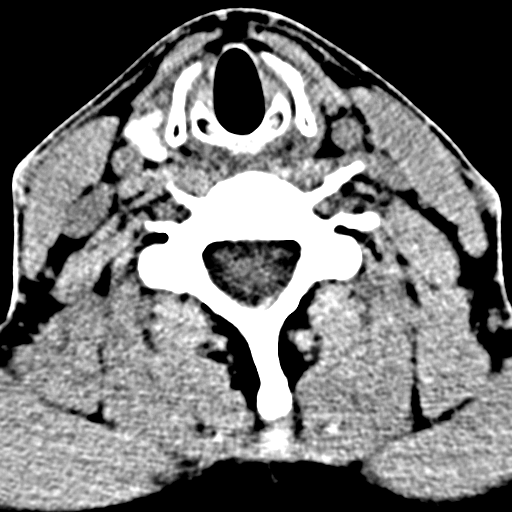

[16 of 47 positions shown; findings below may reference images not displayed]

FINDINGS: No acute intracranial abnormality.  Specifically, no
hemorrhage, hydrocephalus, mass lesion, acute infarction, or
significant intracranial injury.  No acute calvarial abnormality.
Visualized paranasal sinuses and mastoids clear.  Orbital soft
tissues unremarkable.
IMPRESSION: No acute intracranial abnormality.

CT MAXILLOFACIAL
FINDINGS: Fracture through the anterior maxillary cortex overlying
the ninth and tenth teeth.  These teeth are slightly displaced
anteriorly.  No additional facial fracture noted.  Paranasal
sinuses are clear.  Orbital soft tissues are unremarkable.
IMPRESSION: Fracture of the anterior cortex of the maxilla overlying the ninth
and tenth teeth which are displaced anteriorly.

CT CERVICAL SPINE
FINDINGS: Normal alignment.  Prevertebral soft tissues are
normal.  No fracture.  No epidural or paraspinal hematoma.
IMPRESSION: No acute findings.

## 2013-03-25 IMAGING — RF DG ANKLE 2V *R*
1 series · 8 of 8 positions shown · non-contrast
Comparison: Earlier on the same date.

CLINICAL DATA: Right ankle trimalleolar fracture.

RIGHT ANKLE - 2 VIEW

[Series 1: run · 8 of 8 slices shown]
[im 1/8]
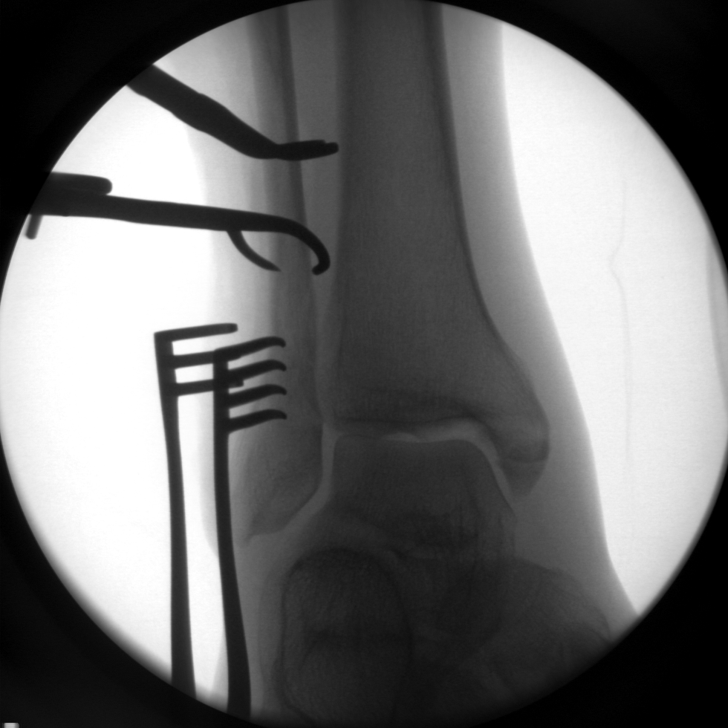
[im 2/8]
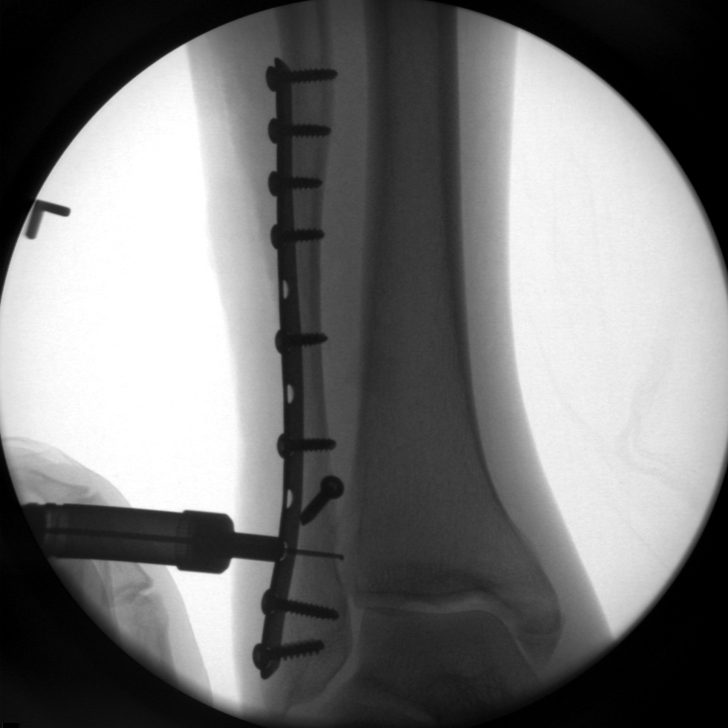
[im 3/8]
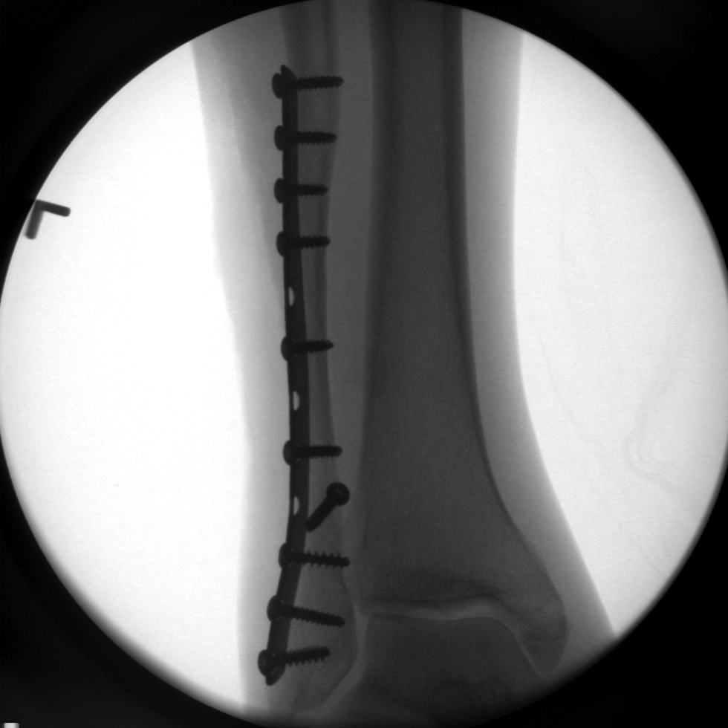
[im 4/8]
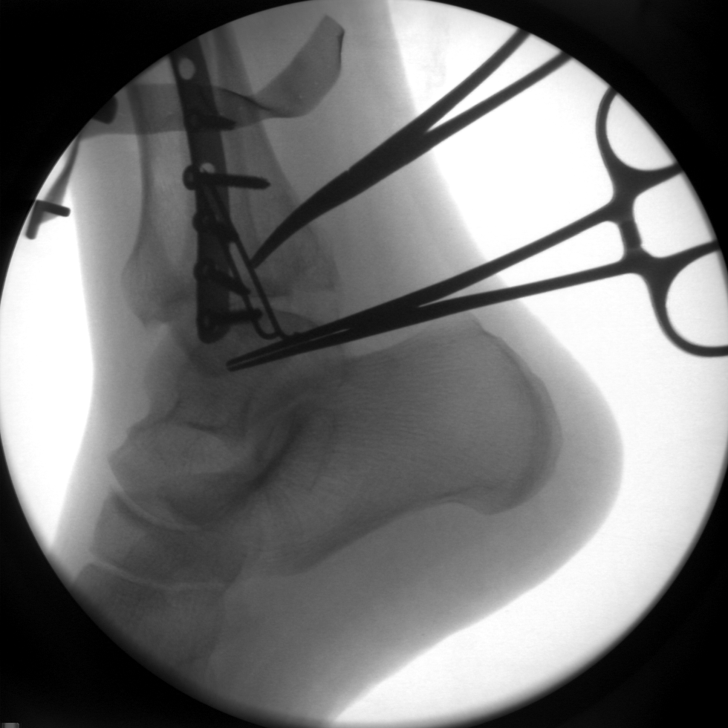
[im 5/8]
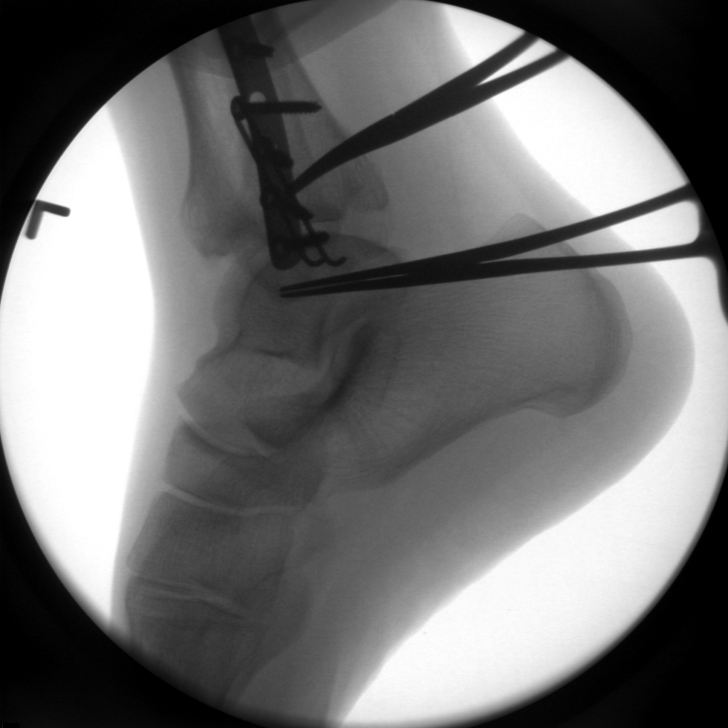
[im 6/8]
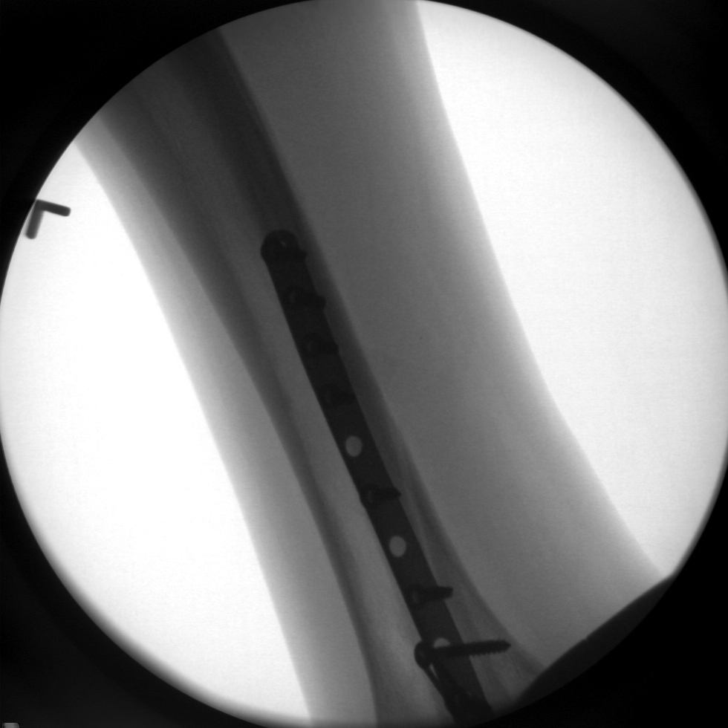
[im 7/8]
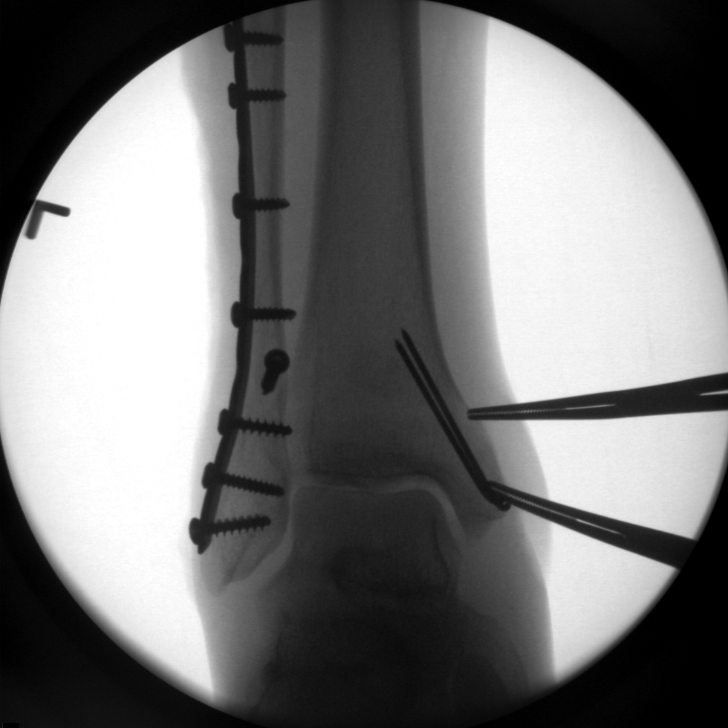
[im 8/8]
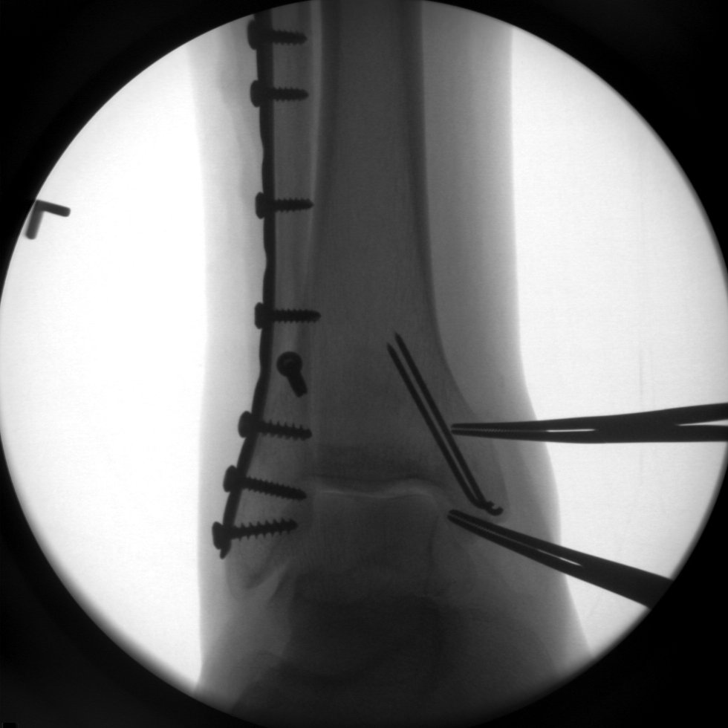

[8 of 8 positions shown; findings below may reference images not displayed]

FINDINGS: Eight C-arm views of the right ankle demonstrate screw
and plate fixation of the previously demonstrated lateral malleolus
fracture and K-wire fixation of the previously demonstrated medial
malleolus fracture.  Anatomic position and alignment is
demonstrated on these views.  There is mild proximal displacement
of the posterior fragment of the posterior malleolus fracture.
IMPRESSION: 1.  Hardware fixation of the previously demonstrated medial and
lateral malleolar fractures with anatomic position and alignment.
2.  Mild proximal displacement of the posterior fragment of the
posterior malleolus fracture.

## 2016-05-03 ENCOUNTER — Emergency Department (HOSPITAL_COMMUNITY)
Admission: EM | Admit: 2016-05-03 | Discharge: 2016-05-03 | Disposition: A | Discharge status: Home / Self Care | Payer: Self-pay | Attending: Emergency Medicine | Admitting: Emergency Medicine

## 2016-05-03 ENCOUNTER — Encounter (HOSPITAL_COMMUNITY): Payer: Self-pay | Admitting: Emergency Medicine

## 2016-05-03 DIAGNOSIS — Z79899 Other long term (current) drug therapy: Secondary | ICD-10-CM | POA: Insufficient documentation

## 2016-05-03 DIAGNOSIS — K029 Dental caries, unspecified: Secondary | ICD-10-CM | POA: Insufficient documentation

## 2016-05-03 DIAGNOSIS — K047 Periapical abscess without sinus: Secondary | ICD-10-CM | POA: Insufficient documentation

## 2016-05-03 DIAGNOSIS — F1290 Cannabis use, unspecified, uncomplicated: Secondary | ICD-10-CM | POA: Insufficient documentation

## 2016-05-03 DIAGNOSIS — F17200 Nicotine dependence, unspecified, uncomplicated: Secondary | ICD-10-CM | POA: Insufficient documentation

## 2016-05-03 MED ORDER — DICLOFENAC SODIUM 75 MG PO TBEC: 75.0000 mg | DELAYED_RELEASE_TABLET | Freq: Two times a day (BID) | ORAL | 0 refills | Status: AC

## 2016-05-03 MED ORDER — CLINDAMYCIN HCL 150 MG PO CAPS
300.0000 mg | ORAL_CAPSULE | Freq: Once | ORAL | Status: AC
  Administered 2016-05-03: 300 mg via ORAL
  Filled 2016-05-03: qty 2

## 2016-05-03 MED ORDER — CLINDAMYCIN HCL 150 MG PO CAPS: 300.0000 mg | ORAL_CAPSULE | Freq: Four times a day (QID) | ORAL | 0 refills | Status: AC

## 2016-05-03 MED ORDER — KETOROLAC TROMETHAMINE 10 MG PO TABS
10.0000 mg | ORAL_TABLET | Freq: Once | ORAL | Status: AC
  Administered 2016-05-03: 10 mg via ORAL
  Filled 2016-05-03: qty 1

## 2016-05-03 NOTE — Discharge Instructions (Signed)
Warm salt water rinses.  It's important that you see a dentist soon. Return here for any worsening symptoms such as increased facial swelling, fever or neck pain

## 2016-05-03 NOTE — ED Provider Notes (Signed)
[facility] Provider Note   CSN: [id number] Arrival date & time: 05/03/16  2153   By signing my name below, I, Bobbie Stack, attest that this documentation has been prepared under the direction and in the presence of [place], PA. Electronically Signed: Bobbie Stack, Scribe. 05/03/16. 10:59 PM. History   Chief Complaint Chief Complaint  Patient presents with  . Dental Pain    The history is provided by the patient. No language interpreter was used.  HPI Comments: Pedro Gray is a 41 y.o. male who presents to the Emergency Department complaining of upper right dental pain that began around 3 days ago. He also reports associated right sided facial swelling that began at that time. He describes a sharp throbbing pain to his right face and radiates to the right ear. Pain associated with chewing.  He has tried taking ibuprofen with no significant relief. He denies fever, neck pain, or difficulty swallowing.    Past Medical History:  Diagnosis Date  . Ankle fracture   . Anxiety   . Gastric ulcer     Patient Active Problem List   Diagnosis Date Noted  . Fracture of ankle, trimalleolar, right, closed 11/15/2010  . Unspecified closed fracture of ankle 11/06/2010  . Triplanar ankle fracture, closed 11/06/2010    Past Surgical History:  Procedure Laterality Date  . ORIF ANKLE FRACTURE  11/03/2010   Procedure: OPEN REDUCTION INTERNAL FIXATION (ORIF) ANKLE FRACTURE;  Surgeon: Fuller Canada, MD;  Location: [facility];  Service: Orthopedics;  Laterality: Right;  with Synthes Small Fragment Plates and Screws       Home Medications    Prior to Admission medications   Medication Sig Start Date End Date Taking? Authorizing Provider  acetaminophen-codeine (TYLENOL #3) 300-30 MG per tablet Take 1-2 tablets by mouth every 6 (six) hours as needed for pain. 07/17/12   Ivery Quale, PA-C  Aspirin-Salicylamide-Caffeine (BC HEADACHE) 325-95-16 MG TABS Take 1 packet by  mouth daily as needed (for pain).    Historical Provider, MD  ibuprofen (ADVIL,MOTRIN) 200 MG tablet Take 200 mg by mouth every 6 (six) hours as needed for pain.    Historical Provider, MD  sulfamethoxazole-trimethoprim (SEPTRA DS) 800-160 MG per tablet Take 1 tablet by mouth every 12 (twelve) hours. 07/17/12   Ivery Quale, PA-C  sulfamethoxazole-trimethoprim (SEPTRA DS) 800-160 MG per tablet Take 1 tablet by mouth every 12 (twelve) hours. 07/17/12   Ivery Quale, PA-C  sulfamethoxazole-trimethoprim (SEPTRA DS) 800-160 MG per tablet Take 1 tablet by mouth every 12 (twelve) hours. 07/17/12   Ivery Quale, PA-C    Family History History reviewed. No pertinent family history.  Social History Social History  Substance Use Topics  . Smoking status: Current Every Day Smoker  . Smokeless tobacco: Never Used  . Alcohol use 1.8 oz/week    3 Cans of beer per week     Comment: occasionally     Allergies   Hydrocodone and Tramadol   Review of Systems Review of Systems  Constitutional: Negative for appetite change, fatigue and fever.  HENT: Positive for dental problem and facial swelling. Negative for congestion, ear discharge and sinus pressure.   Respiratory: Negative for cough.   Cardiovascular: Negative for chest pain.  Gastrointestinal: Negative for diarrhea and vomiting.  Musculoskeletal: Negative for neck pain and neck stiffness.  Skin: Negative for rash.  Neurological: Negative for speech difficulty and headaches.     Physical Exam Updated Vital Signs BP 145/76 (BP Location: Left Arm)   Pulse  85   Temp 98.7 F (37.1 C) (Oral)   Resp 20   Ht 6' (1.829 m)   Wt 175 lb (79.4 kg)   SpO2 99%   BMI 23.73 kg/m   Physical Exam  Constitutional: He is oriented to person, place, and time. He appears well-developed and well-nourished. No distress.  HENT:  Head: Normocephalic.  Wide spread dental decay with tenderness of the right upper first canine. Mild right sided facial swelling  noted. Erythema and tenderness of the surrounding gingiva without obvious abscess.   Eyes: EOM are normal.  Neck: Normal range of motion.  Cardiovascular: Normal rate and regular rhythm.   Pulmonary/Chest: Effort normal. No respiratory distress.  Abdominal: He exhibits no distension.  Musculoskeletal: Normal range of motion.  Neurological: He is alert and oriented to person, place, and time.  Skin: Skin is warm. No rash noted.  Psychiatric: He has a normal mood and affect.  Nursing note and vitals reviewed.    ED Treatments / Results  DIAGNOSTIC STUDIES: Oxygen Saturation is 99% on RA, normal by my interpretation.    COORDINATION OF CARE: 10:55 PM Discussed treatment plan with pt at bedside and pt agreed to plan. I will give the patient some pain medications and antibiotics. I talked to the patient about some dentists in the area that could be of assistance.  Labs (all labs ordered are listed, but only abnormal results are displayed) Labs Reviewed - No data to display  EKG  EKG Interpretation None       Radiology No results found.  Procedures Procedures (including critical care time)  Medications Ordered in ED Medications - No data to display   Initial Impression / Assessment and Plan / ED Course  I have reviewed the triage vital signs and the nursing notes.  Pertinent labs & imaging results that were available during my care of the patient were reviewed by me and considered in my medical decision making (see chart for details).     Pt reviewed on the [facility], no medications on file.   Pt is well appearing.  Airway if patent.  Multiple dental caries with likely developing abscess.  No drainable abscess at present.  No concerning symptoms for Ludwig's angina.  Dental referral info given, rx for clinda and diclofenac.  Pt agrees to plan, stable for d/c  Final Clinical Impressions(s) / ED Diagnoses   Final diagnoses:  Dental abscess    New Prescriptions New  Prescriptions   No medications on file   I personally performed the services described in this documentation, which was scribed in my presence. The recorded information has been reviewed and is accurate.    Pauline Ausammy Seung Nidiffer, PA-C 05/03/16 2330    Samuel JesterKathleen McManus, DO 05/05/16 1530

## 2016-05-03 NOTE — ED Triage Notes (Signed)
Patient complaining of right upper dental pain and swelling x 3 days.
